# Patient Record
Sex: Male | Born: 1985 | Race: Black or African American | Hispanic: No | Marital: Single | Smoking: Former smoker
Health system: Southern US, Community
[De-identification: ages and names within clinical notes are randomized; demographics above are authoritative.]

## PROBLEM LIST (undated history)

## (undated) DIAGNOSIS — R51 Headache: Secondary | ICD-10-CM

## (undated) DIAGNOSIS — R519 Headache, unspecified: Secondary | ICD-10-CM

## (undated) HISTORY — PX: WISDOM TOOTH EXTRACTION: SHX21

## (undated) HISTORY — DX: Headache, unspecified: R51.9

## (undated) HISTORY — DX: Headache: R51

---
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
## 2007-12-09 ENCOUNTER — Emergency Department (HOSPITAL_COMMUNITY): Admission: EM | Admit: 2007-12-09 | Discharge: 2007-12-09 | Payer: Self-pay | Admitting: Emergency Medicine

## 2008-04-22 ENCOUNTER — Emergency Department (HOSPITAL_COMMUNITY): Admission: EM | Admit: 2008-04-22 | Discharge: 2008-04-22 | Payer: Self-pay | Admitting: Emergency Medicine

## 2008-04-27 ENCOUNTER — Emergency Department (HOSPITAL_COMMUNITY): Admission: EM | Admit: 2008-04-27 | Discharge: 2008-04-27 | Payer: Self-pay | Admitting: Emergency Medicine

## 2008-08-05 ENCOUNTER — Emergency Department (HOSPITAL_COMMUNITY): Admission: EM | Admit: 2008-08-05 | Discharge: 2008-08-05 | Payer: Self-pay | Admitting: Emergency Medicine

## 2008-10-18 ENCOUNTER — Emergency Department (HOSPITAL_COMMUNITY): Admission: EM | Admit: 2008-10-18 | Discharge: 2008-10-19 | Payer: Self-pay | Admitting: Emergency Medicine

## 2009-07-23 ENCOUNTER — Emergency Department (HOSPITAL_COMMUNITY): Admission: EM | Admit: 2009-07-23 | Discharge: 2009-07-23 | Payer: Self-pay | Admitting: Emergency Medicine

## 2009-08-25 ENCOUNTER — Emergency Department (HOSPITAL_COMMUNITY): Admission: EM | Admit: 2009-08-25 | Discharge: 2009-08-25 | Payer: Self-pay | Admitting: Emergency Medicine

## 2009-10-02 ENCOUNTER — Encounter: Admission: RE | Admit: 2009-10-02 | Discharge: 2009-10-02 | Payer: Self-pay | Admitting: Family Medicine

## 2010-02-14 ENCOUNTER — Encounter
Admission: RE | Admit: 2010-02-14 | Discharge: 2010-02-14 | Payer: Self-pay | Source: Home / Self Care | Attending: Family Medicine | Admitting: Family Medicine

## 2010-02-25 ENCOUNTER — Other Ambulatory Visit (HOSPITAL_COMMUNITY): Payer: Self-pay | Admitting: Orthopedic Surgery

## 2010-02-25 DIAGNOSIS — M79673 Pain in unspecified foot: Secondary | ICD-10-CM

## 2010-02-27 ENCOUNTER — Other Ambulatory Visit (HOSPITAL_BASED_OUTPATIENT_CLINIC_OR_DEPARTMENT_OTHER): Payer: Self-pay | Admitting: Orthopedic Surgery

## 2010-02-27 ENCOUNTER — Other Ambulatory Visit (HOSPITAL_COMMUNITY): Payer: Self-pay

## 2010-02-27 DIAGNOSIS — R52 Pain, unspecified: Secondary | ICD-10-CM

## 2010-02-28 ENCOUNTER — Encounter (HOSPITAL_COMMUNITY): Payer: Self-pay

## 2010-02-28 ENCOUNTER — Ambulatory Visit (HOSPITAL_COMMUNITY)
Admission: RE | Admit: 2010-02-28 | Discharge: 2010-02-28 | Disposition: A | Discharge status: Home / Self Care | Payer: BC Managed Care – PPO | Source: Ambulatory Visit | Attending: Orthopedic Surgery | Admitting: Orthopedic Surgery

## 2010-02-28 DIAGNOSIS — M25476 Effusion, unspecified foot: Secondary | ICD-10-CM | POA: Insufficient documentation

## 2010-02-28 DIAGNOSIS — M79609 Pain in unspecified limb: Secondary | ICD-10-CM | POA: Insufficient documentation

## 2010-02-28 DIAGNOSIS — M79673 Pain in unspecified foot: Secondary | ICD-10-CM

## 2010-02-28 DIAGNOSIS — M25473 Effusion, unspecified ankle: Secondary | ICD-10-CM | POA: Insufficient documentation

## 2010-02-28 DIAGNOSIS — R52 Pain, unspecified: Secondary | ICD-10-CM

## 2010-02-28 DIAGNOSIS — R609 Edema, unspecified: Secondary | ICD-10-CM | POA: Insufficient documentation

## 2010-03-27 ENCOUNTER — Other Ambulatory Visit (HOSPITAL_COMMUNITY): Payer: Self-pay | Admitting: Orthopedic Surgery

## 2010-03-27 DIAGNOSIS — M25571 Pain in right ankle and joints of right foot: Secondary | ICD-10-CM

## 2010-04-04 ENCOUNTER — Ambulatory Visit (HOSPITAL_COMMUNITY)
Admission: RE | Admit: 2010-04-04 | Discharge: 2010-04-04 | Disposition: A | Discharge status: Home / Self Care | Payer: BC Managed Care – PPO | Source: Ambulatory Visit | Attending: Orthopedic Surgery | Admitting: Orthopedic Surgery

## 2010-04-04 DIAGNOSIS — M25571 Pain in right ankle and joints of right foot: Secondary | ICD-10-CM

## 2010-04-04 DIAGNOSIS — M25579 Pain in unspecified ankle and joints of unspecified foot: Secondary | ICD-10-CM | POA: Insufficient documentation

## 2010-04-04 DIAGNOSIS — M722 Plantar fascial fibromatosis: Secondary | ICD-10-CM | POA: Insufficient documentation
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
## 2010-08-18 ENCOUNTER — Ambulatory Visit: Payer: BC Managed Care – PPO | Attending: Orthopedic Surgery

## 2010-08-18 DIAGNOSIS — R262 Difficulty in walking, not elsewhere classified: Secondary | ICD-10-CM | POA: Insufficient documentation

## 2010-08-18 DIAGNOSIS — IMO0001 Reserved for inherently not codable concepts without codable children: Secondary | ICD-10-CM | POA: Insufficient documentation

## 2010-08-18 DIAGNOSIS — M2550 Pain in unspecified joint: Secondary | ICD-10-CM | POA: Insufficient documentation

## 2010-08-18 DIAGNOSIS — M2560 Stiffness of unspecified joint, not elsewhere classified: Secondary | ICD-10-CM | POA: Insufficient documentation

## 2010-08-18 DIAGNOSIS — R5381 Other malaise: Secondary | ICD-10-CM | POA: Insufficient documentation

## 2010-08-19 ENCOUNTER — Ambulatory Visit: Payer: BC Managed Care – PPO | Admitting: Physical Therapy

## 2010-08-28 ENCOUNTER — Ambulatory Visit: Payer: BC Managed Care – PPO | Attending: Orthopedic Surgery

## 2010-08-28 DIAGNOSIS — IMO0001 Reserved for inherently not codable concepts without codable children: Secondary | ICD-10-CM | POA: Insufficient documentation

## 2010-08-28 DIAGNOSIS — R262 Difficulty in walking, not elsewhere classified: Secondary | ICD-10-CM | POA: Insufficient documentation

## 2010-08-28 DIAGNOSIS — M2550 Pain in unspecified joint: Secondary | ICD-10-CM | POA: Insufficient documentation

## 2010-08-28 DIAGNOSIS — R5381 Other malaise: Secondary | ICD-10-CM | POA: Insufficient documentation

## 2010-08-28 DIAGNOSIS — M2560 Stiffness of unspecified joint, not elsewhere classified: Secondary | ICD-10-CM | POA: Insufficient documentation

## 2010-09-02 ENCOUNTER — Ambulatory Visit: Payer: BC Managed Care – PPO | Admitting: Physical Therapy

## 2010-09-03 ENCOUNTER — Ambulatory Visit: Payer: BC Managed Care – PPO | Admitting: Physical Therapy

## 2010-09-04 ENCOUNTER — Ambulatory Visit: Payer: BC Managed Care – PPO | Admitting: Physical Therapy

## 2010-09-09 ENCOUNTER — Ambulatory Visit: Payer: BC Managed Care – PPO | Admitting: Physical Therapy

## 2010-09-12 ENCOUNTER — Ambulatory Visit: Payer: BC Managed Care – PPO

## 2010-09-16 ENCOUNTER — Ambulatory Visit: Payer: BC Managed Care – PPO | Admitting: Physical Therapy

## 2010-09-17 ENCOUNTER — Ambulatory Visit: Payer: BC Managed Care – PPO | Admitting: Physical Therapy

## 2010-09-22 ENCOUNTER — Ambulatory Visit: Payer: BC Managed Care – PPO | Admitting: Physical Therapy

## 2010-09-30 ENCOUNTER — Ambulatory Visit: Payer: BC Managed Care – PPO

## 2010-10-06 ENCOUNTER — Encounter: Payer: BC Managed Care – PPO | Admitting: Physical Therapy

## 2010-10-08 ENCOUNTER — Encounter: Payer: BC Managed Care – PPO | Admitting: Physical Therapy

## 2010-10-22 ENCOUNTER — Ambulatory Visit: Payer: BC Managed Care – PPO | Attending: Orthopedic Surgery | Admitting: Physical Therapy

## 2010-10-22 ENCOUNTER — Ambulatory Visit: Payer: BC Managed Care – PPO | Admitting: Physical Therapy

## 2010-10-22 DIAGNOSIS — IMO0001 Reserved for inherently not codable concepts without codable children: Secondary | ICD-10-CM | POA: Insufficient documentation

## 2010-10-22 DIAGNOSIS — M2560 Stiffness of unspecified joint, not elsewhere classified: Secondary | ICD-10-CM | POA: Insufficient documentation

## 2010-10-22 DIAGNOSIS — M2550 Pain in unspecified joint: Secondary | ICD-10-CM | POA: Insufficient documentation

## 2010-10-22 DIAGNOSIS — R5381 Other malaise: Secondary | ICD-10-CM | POA: Insufficient documentation

## 2010-10-22 DIAGNOSIS — R262 Difficulty in walking, not elsewhere classified: Secondary | ICD-10-CM | POA: Insufficient documentation

## 2010-10-30 ENCOUNTER — Encounter: Payer: BC Managed Care – PPO | Admitting: Rehabilitation

## 2010-10-31 ENCOUNTER — Ambulatory Visit: Payer: BC Managed Care – PPO | Attending: Orthopedic Surgery | Admitting: Rehabilitation

## 2010-10-31 DIAGNOSIS — IMO0001 Reserved for inherently not codable concepts without codable children: Secondary | ICD-10-CM | POA: Insufficient documentation

## 2010-10-31 DIAGNOSIS — R5381 Other malaise: Secondary | ICD-10-CM | POA: Insufficient documentation

## 2010-10-31 DIAGNOSIS — R262 Difficulty in walking, not elsewhere classified: Secondary | ICD-10-CM | POA: Insufficient documentation

## 2010-10-31 DIAGNOSIS — M2560 Stiffness of unspecified joint, not elsewhere classified: Secondary | ICD-10-CM | POA: Insufficient documentation

## 2010-10-31 DIAGNOSIS — M2550 Pain in unspecified joint: Secondary | ICD-10-CM | POA: Insufficient documentation

## 2010-11-04 ENCOUNTER — Encounter: Payer: BC Managed Care – PPO | Admitting: Physical Therapy

## 2010-11-06 ENCOUNTER — Ambulatory Visit: Payer: BC Managed Care – PPO

## 2010-11-17 ENCOUNTER — Ambulatory Visit: Payer: BC Managed Care – PPO

## 2010-11-19 ENCOUNTER — Ambulatory Visit: Payer: BC Managed Care – PPO | Admitting: Rehabilitation

## 2010-11-24 ENCOUNTER — Ambulatory Visit: Payer: BC Managed Care – PPO | Admitting: Rehabilitation

## 2010-11-26 ENCOUNTER — Ambulatory Visit: Payer: BC Managed Care – PPO | Admitting: Physical Therapy

## 2010-12-09 ENCOUNTER — Ambulatory Visit: Payer: BC Managed Care – PPO | Attending: Orthopedic Surgery | Admitting: Rehabilitation

## 2010-12-09 DIAGNOSIS — M2560 Stiffness of unspecified joint, not elsewhere classified: Secondary | ICD-10-CM | POA: Insufficient documentation

## 2010-12-09 DIAGNOSIS — IMO0001 Reserved for inherently not codable concepts without codable children: Secondary | ICD-10-CM | POA: Insufficient documentation

## 2010-12-09 DIAGNOSIS — R262 Difficulty in walking, not elsewhere classified: Secondary | ICD-10-CM | POA: Insufficient documentation

## 2010-12-09 DIAGNOSIS — R5381 Other malaise: Secondary | ICD-10-CM | POA: Insufficient documentation

## 2010-12-09 DIAGNOSIS — M2550 Pain in unspecified joint: Secondary | ICD-10-CM | POA: Insufficient documentation

## 2010-12-10 ENCOUNTER — Ambulatory Visit: Payer: BC Managed Care – PPO

## 2010-12-10 ENCOUNTER — Encounter: Payer: BC Managed Care – PPO | Admitting: Rehabilitation

## 2011-06-12 ENCOUNTER — Encounter (HOSPITAL_COMMUNITY): Payer: Self-pay

## 2011-06-12 ENCOUNTER — Emergency Department (HOSPITAL_COMMUNITY)
Admission: EM | Admit: 2011-06-12 | Discharge: 2011-06-12 | Disposition: A | Discharge status: Home / Self Care | Payer: No Typology Code available for payment source | Attending: Emergency Medicine | Admitting: Emergency Medicine

## 2011-06-12 ENCOUNTER — Emergency Department (HOSPITAL_COMMUNITY): Payer: No Typology Code available for payment source

## 2011-06-12 DIAGNOSIS — M25579 Pain in unspecified ankle and joints of unspecified foot: Secondary | ICD-10-CM | POA: Insufficient documentation

## 2011-06-12 DIAGNOSIS — Z79899 Other long term (current) drug therapy: Secondary | ICD-10-CM | POA: Insufficient documentation

## 2011-06-12 DIAGNOSIS — Y92410 Unspecified street and highway as the place of occurrence of the external cause: Secondary | ICD-10-CM | POA: Insufficient documentation

## 2011-06-12 DIAGNOSIS — Y998 Other external cause status: Secondary | ICD-10-CM | POA: Insufficient documentation

## 2011-06-12 DIAGNOSIS — M546 Pain in thoracic spine: Secondary | ICD-10-CM | POA: Insufficient documentation

## 2011-06-12 DIAGNOSIS — M25519 Pain in unspecified shoulder: Secondary | ICD-10-CM | POA: Insufficient documentation

## 2011-06-12 MED ORDER — CYCLOBENZAPRINE HCL 10 MG PO TABS: 10.0000 mg | ORAL_TABLET | Freq: Two times a day (BID) | ORAL | Status: AC | PRN

## 2011-06-12 NOTE — Progress Notes (Signed)
Orthopedic Tech Progress Note Patient Details:  Carl Ward 1985/11/24 [id number]  Other Ortho Devices Type of Ortho Device: ASO Ortho Device Location: right ankle Ortho Device Interventions: Application   Zaviyar Rahal 06/12/2011, 5:10 PM
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
## 2011-06-12 NOTE — ED Notes (Signed)
Patient transported to X-ray 
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
## 2011-06-14 NOTE — ED Provider Notes (Signed)
Medical screening examination/treatment/procedure(s) were performed by non-physician practitioner and as supervising physician I was immediately available for consultation/collaboration.  Flint Melter, MD 06/14/11 1550

## 2013-01-16 IMAGING — CR DG FOOT 2V*R*
2 series · 2 of 2 positions shown · non-contrast
Comparison: None.

CLINICAL DATA: Right heel pain since an injury 1 month ago.

RIGHT FOOT - 2 VIEW

[view not recorded (1 of 2)]
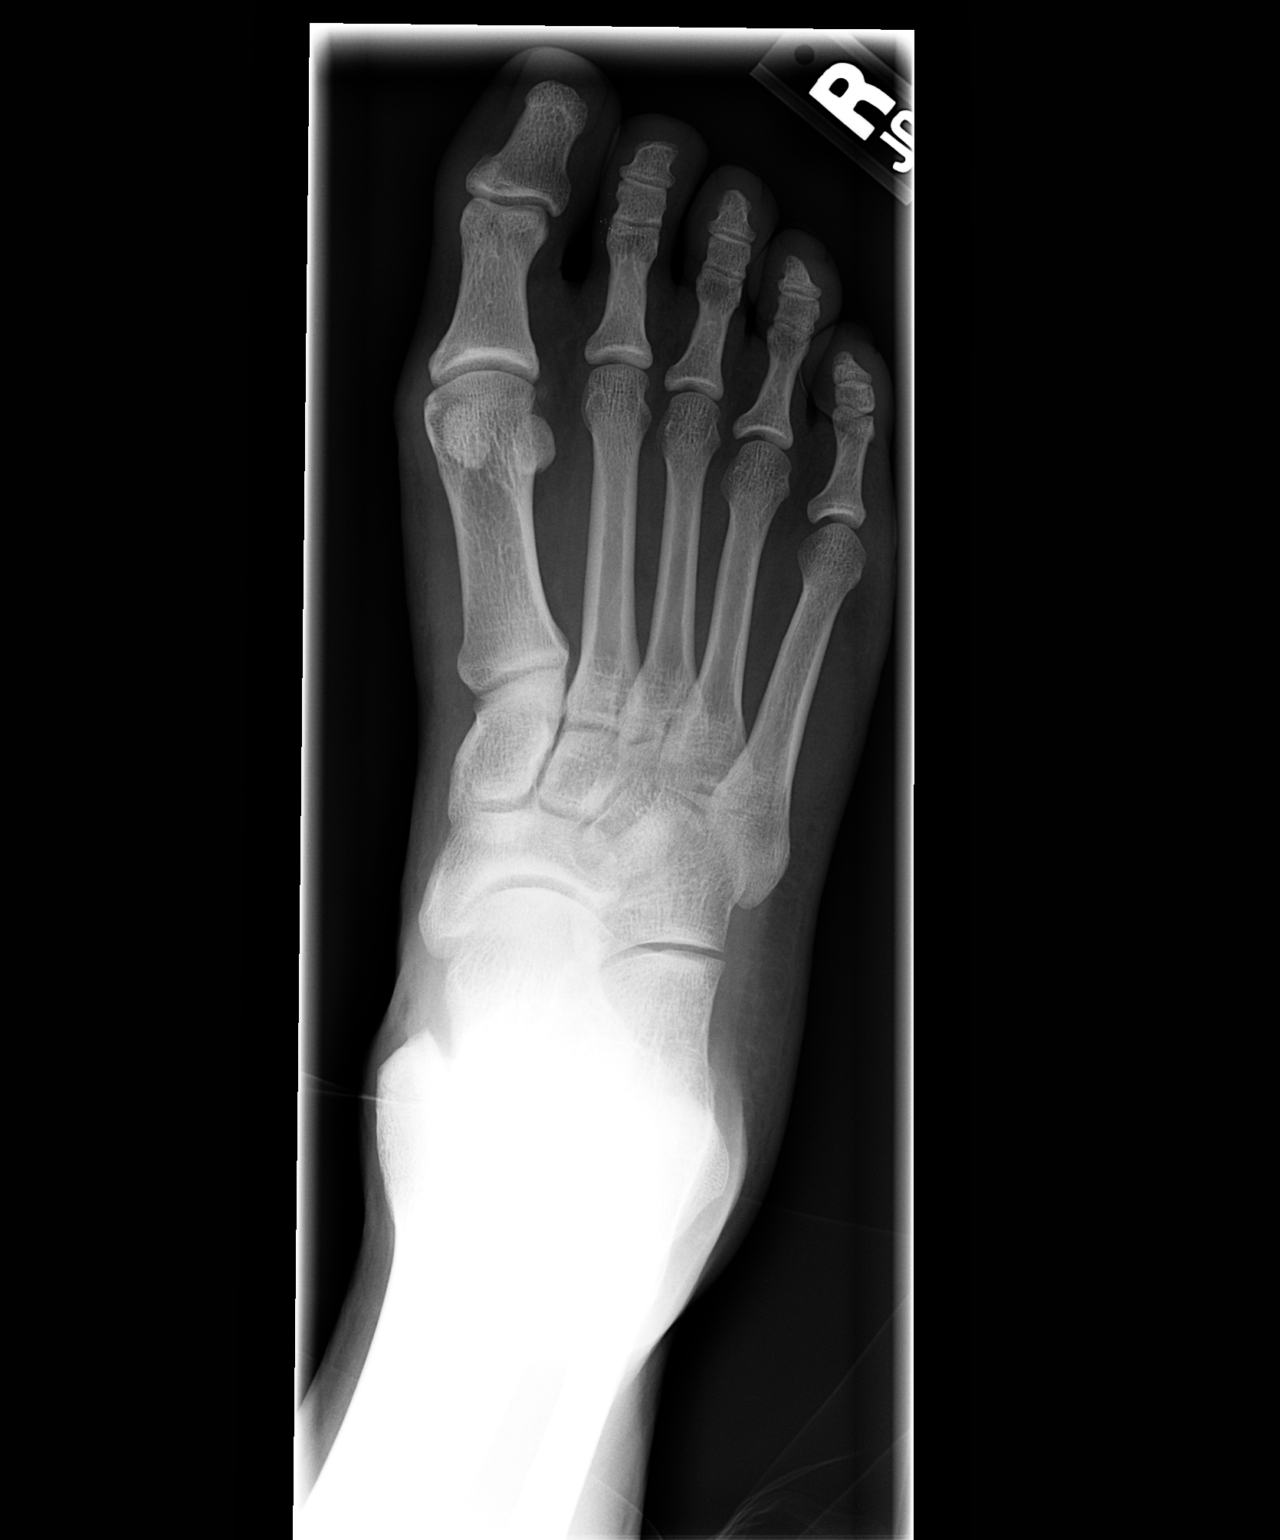

[view not recorded (2 of 2)]
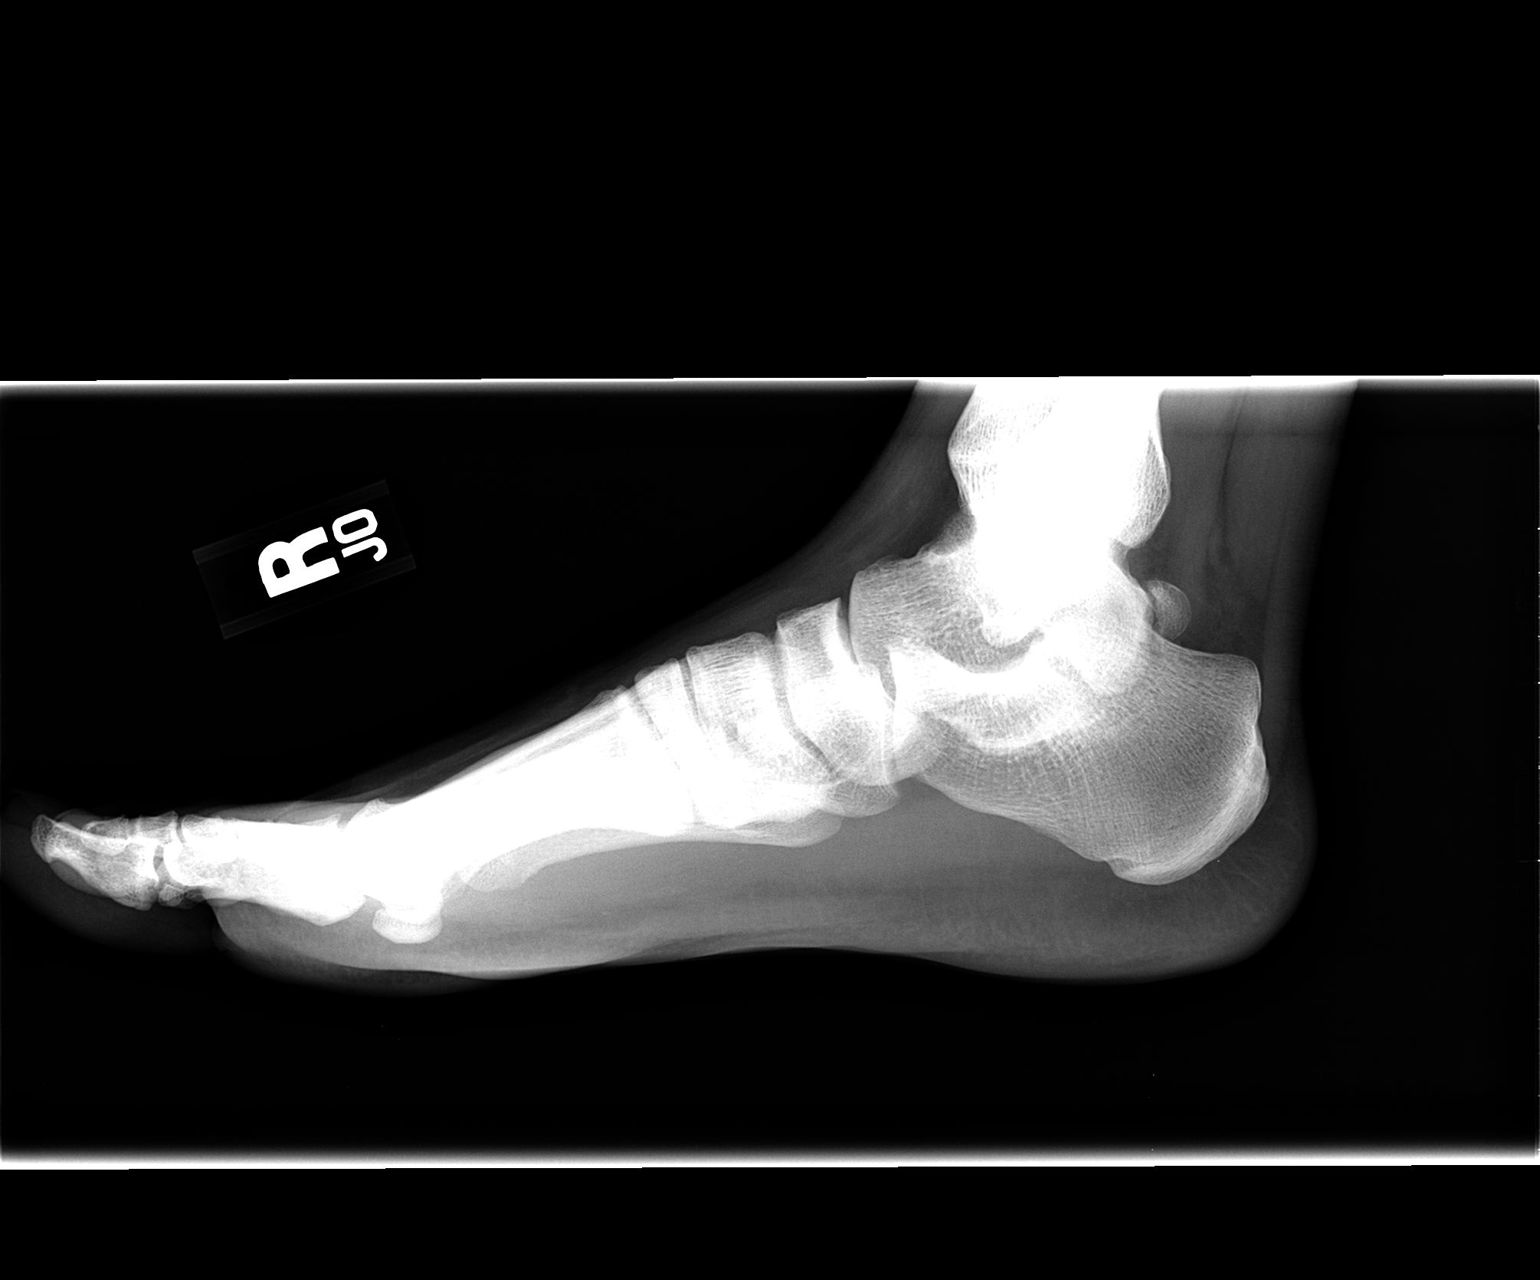

[2 of 2 positions shown; findings below may reference images not displayed]

FINDINGS: The osseous structures of the right foot are normal.
Specifically, the calcaneus is normal.  Os trigonum is noted at the
posterior aspect of the talus, a normal variant.
IMPRESSION: Normal right foot.

## 2014-09-23 ENCOUNTER — Emergency Department (HOSPITAL_COMMUNITY)
Admission: EM | Admit: 2014-09-23 | Discharge: 2014-09-23 | Disposition: A | Discharge status: Home / Self Care | Payer: Self-pay | Attending: Emergency Medicine | Admitting: Emergency Medicine

## 2014-09-23 ENCOUNTER — Emergency Department (HOSPITAL_COMMUNITY): Payer: Self-pay

## 2014-09-23 ENCOUNTER — Encounter (HOSPITAL_COMMUNITY): Payer: Self-pay

## 2014-09-23 DIAGNOSIS — Z880 Allergy status to penicillin: Secondary | ICD-10-CM | POA: Insufficient documentation

## 2014-09-23 DIAGNOSIS — R0600 Dyspnea, unspecified: Secondary | ICD-10-CM | POA: Insufficient documentation

## 2014-09-23 DIAGNOSIS — R51 Headache: Secondary | ICD-10-CM | POA: Insufficient documentation

## 2014-09-23 DIAGNOSIS — R42 Dizziness and giddiness: Secondary | ICD-10-CM | POA: Insufficient documentation

## 2014-09-23 DIAGNOSIS — R0982 Postnasal drip: Secondary | ICD-10-CM | POA: Insufficient documentation

## 2014-09-23 DIAGNOSIS — H578 Other specified disorders of eye and adnexa: Secondary | ICD-10-CM | POA: Insufficient documentation

## 2014-09-23 LAB — I-STAT CHEM 8, ED
BUN: 19 mg/dL (ref 6–20)
CALCIUM ION: 1.06 mmol/L — AB (ref 1.12–1.23)
Chloride: 105 mmol/L (ref 101–111)
Creatinine, Ser: 0.8 mg/dL (ref 0.61–1.24)
GLUCOSE: 102 mg/dL — AB (ref 65–99)
HCT: 49 % (ref 39.0–52.0)
Hemoglobin: 16.7 g/dL (ref 13.0–17.0)
Potassium: 3.7 mmol/L (ref 3.5–5.1)
SODIUM: 139 mmol/L (ref 135–145)
TCO2: 22 mmol/L (ref 0–100)
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
## 2014-09-23 NOTE — ED Notes (Signed)
Pt ambulated w/ a steady gait around nursing station and O2 saturation remained 100% on RA.

## 2014-09-23 NOTE — ED Provider Notes (Signed)
CSN: [id number]     Arrival date & time 09/23/14  0012 History  This chart was scribed for Carl Severin, MD by Doreatha Martin, ED Scribe. This patient was seen in room RESB/RESB and the patient's care was started at 12:22 AM.     Chief Complaint  Patient presents with  . Shortness of Breath   The history is provided by the patient. No language interpreter was used.    HPI Comments: Carl Ward is a 29 y.o. male who presents to the Emergency Department complaining of moderate SOB onset 4 years ago and worsened 30 minutes ago with associated postnasal drip in the mornings, HA, dizziness, a constant "smell of bleach" since cleaning this afternoon. He notes that he stayed in a house with a lot of mold since 2012 and reports that it feels difficult to breath there. Pt states that he moved out of the house today. He notes that he cleaned with household bleach at 1430 and has not been in the environment since. Pt is a non-smoker. He notes some occasional recreational drug use, but none today. He denies cough, wheezing.   History reviewed. No pertinent past medical history. History reviewed. No pertinent past surgical history. History reviewed. No pertinent family history. Social History  Substance Use Topics  . Smoking status: Never Smoker   . Smokeless tobacco: None  . Alcohol Use: Yes    Review of Systems  HENT: Positive for postnasal drip.   Respiratory: Positive for shortness of breath. Negative for cough and wheezing.   Neurological: Positive for dizziness and headaches.  All other systems reviewed and are negative.  Allergies  Penicillins  Home Medications   Prior to Admission medications   Not on File   BP 123/79 mmHg  Pulse 71  Temp(Src) 97.8 F (36.6 C) (Oral)  Resp 20  Ht  (1.727 m)  Wt 160 lb (72.576 kg)  BMI 24.33 kg/m2  SpO2 100% Physical Exam  Constitutional: He is oriented to person, place, and time. He appears well-developed and well-nourished. He appears  distressed (anxious appearing).  HENT:  Head: Normocephalic and atraumatic.  Nose: Nose normal.  Mouth/Throat: Oropharynx is clear and moist.  Eyes: EOM are normal. Pupils are equal, round, and reactive to light.  Conjunctiva erythematous  Neck: Normal range of motion. Neck supple. No JVD present. No tracheal deviation present. No thyromegaly present.  Cardiovascular: Normal rate, regular rhythm, normal heart sounds and intact distal pulses.  Exam reveals no gallop and no friction rub.   No murmur heard. Pulmonary/Chest: Effort normal and breath sounds normal. No stridor. No respiratory distress. He has no wheezes. He has no rales. He exhibits no tenderness.  Abdominal: Soft. Bowel sounds are normal. He exhibits no distension and no mass. There is no tenderness. There is no rebound and no guarding.  Musculoskeletal: Normal range of motion. He exhibits no edema or tenderness.  Lymphadenopathy:    He has no cervical adenopathy.  Neurological: He is alert and oriented to person, place, and time. He displays normal reflexes. He exhibits normal muscle tone. Coordination normal.  Skin: Skin is warm and dry. No rash noted. No erythema. No pallor.  Psychiatric: He has a normal mood and affect. His behavior is normal. Judgment and thought content normal.  Nursing note and vitals reviewed.   ED Course  Procedures (including critical care time) DIAGNOSTIC STUDIES: Oxygen Saturation is 100% on RA, normal by my interpretation.    COORDINATION OF CARE: 12:27 AM Discussed treatment  plan with pt at bedside and pt agreed to plan.   Labs Review Labs Reviewed  I-STAT CHEM 8, ED - Abnormal; Notable for the following:    Glucose, Bld 102 (*)    Calcium, Ion 1.06 (*)    All other components within normal limits    Imaging Review Dg Chest 2 View  09/23/2014   CLINICAL DATA:  Shortness of breath and dyspnea, onset 4 years ago. Patient is been staying in house that has mold and he was cleaning it with  bleach.  EXAM: CHEST  2 VIEW  COMPARISON:  None.  FINDINGS: The heart size and mediastinal contours are within normal limits. Both lungs are clear. The visualized skeletal structures are unremarkable.  IMPRESSION: No active cardiopulmonary disease.   Electronically Signed   By: Burman Nieves M.D.   On: 09/23/2014 01:24   I have personally reviewed and evaluated these images and lab results as part of my medical decision-making.   EKG Interpretation   Date/Time:  Sunday September 23 2014 00:15:22 EDT Ventricular Rate:  67 PR Interval:  166 QRS Duration: 108 QT Interval:  400 QTC Calculation: 422 R Axis:   71 Text Interpretation:  Sinus rhythm No old tracing to compare Confirmed by  Syriana Croslin  MD, Jensyn Shave ([id number]) on 09/23/2014 12:29:03 AM      MDM   Final diagnoses:  Dyspnea    I personally performed the services described in this documentation, which was scribed in my presence. The recorded information has been reviewed and is accurate.  29 yo male with sob for 4 years, acutely worse today after using household bleach.  Pt speaking in full paragraphs, no respiratory distress.  Eyes reddened.  Pt has refused ABG.  Will get cxr, bmp.    Carl Severin, MD 09/23/14 [phone]

## 2014-09-23 NOTE — Discharge Instructions (Signed)
Your workup today has not shown any acute abnormalities.  Your symptoms may be secondary to bleach use earlier today.  They should improve with time.  Avoid smoking any substances for the next week.  This will only irritate your lungs.  Further.  Return to the emergency department for worsening condition or new concerning symptoms.   Shortness of Breath Shortness of breath means you have trouble breathing. It could also mean that you have a medical problem. You should get immediate medical care for shortness of breath. CAUSES   Not enough oxygen in the air such as with high altitudes or a smoke-filled room.  Certain lung diseases, infections, or problems.  Heart disease or conditions, such as angina or heart failure.  Low red blood cells (anemia).  Poor physical fitness, which can cause shortness of breath when you exercise.  Chest or back injuries or stiffness.  Being overweight.  Smoking.  Anxiety, which can make you feel like you are not getting enough air. DIAGNOSIS  Serious medical problems can often be found during your physical exam. Tests may also be done to determine why you are having shortness of breath. Tests may include:  Chest X-rays.  Lung function tests.  Blood tests.  An electrocardiogram (ECG).  An ambulatory electrocardiogram. An ambulatory ECG records your heartbeat patterns over a 24-hour period.  Exercise testing.  A transthoracic echocardiogram (TTE). During echocardiography, sound waves are used to evaluate how blood flows through your heart.  A transesophageal echocardiogram (TEE).  Imaging scans. Your health care provider may not be able to find a cause for your shortness of breath after your exam. In this case, it is important to have a follow-up exam with your health care provider as directed.  TREATMENT  Treatment for shortness of breath depends on the cause of your symptoms and can vary greatly. HOME CARE INSTRUCTIONS   Do not smoke.  Smoking is a common cause of shortness of breath. If you smoke, ask for help to quit.  Avoid being around chemicals or things that may bother your breathing, such as paint fumes and dust.  Rest as needed. Slowly resume your usual activities.  If medicines were prescribed, take them as directed for the full length of time directed. This includes oxygen and any inhaled medicines.  Keep all follow-up appointments as directed by your health care provider. SEEK MEDICAL CARE IF:   Your condition does not improve in the time expected.  You have a hard time doing your normal activities even with rest.  You have any new symptoms. SEEK IMMEDIATE MEDICAL CARE IF:   Your shortness of breath gets worse.  You feel light-headed, faint, or develop a cough not controlled with medicines.  You start coughing up blood.  You have pain with breathing.  You have chest pain or pain in your arms, shoulders, or abdomen.  You have a fever.  You are unable to walk up stairs or exercise the way you normally do. MAKE SURE YOU:  Understand these instructions.  Will watch your condition.  Will get help right away if you are not doing well or get worse. Document Released: 10/07/2000 Document Revised: 01/17/2013 Document Reviewed: 03/30/2011 [facility] Patient Information 2015 [place], [place]. This information is not intended to replace advice given to you by your health care provider. Make sure you discuss any questions you have with your health care provider.

## 2014-12-10 ENCOUNTER — Emergency Department (INDEPENDENT_AMBULATORY_CARE_PROVIDER_SITE_OTHER)
Admission: EM | Admit: 2014-12-10 | Discharge: 2014-12-10 | Disposition: A | Discharge status: Home / Self Care | Payer: Self-pay | Source: Home / Self Care | Attending: Family Medicine | Admitting: Family Medicine

## 2014-12-10 ENCOUNTER — Encounter (HOSPITAL_COMMUNITY): Payer: Self-pay | Admitting: *Deleted

## 2014-12-10 DIAGNOSIS — J302 Other seasonal allergic rhinitis: Secondary | ICD-10-CM

## 2014-12-10 MED ORDER — AZITHROMYCIN 250 MG PO TABS: ORAL_TABLET | ORAL | Status: DC

## 2014-12-10 MED ORDER — PREDNISONE 50 MG PO TABS: ORAL_TABLET | ORAL | Status: DC

## 2014-12-10 MED ORDER — IPRATROPIUM BROMIDE 0.06 % NA SOLN: 2.0000 | Freq: Four times a day (QID) | NASAL | Status: DC

## 2014-12-10 NOTE — ED Provider Notes (Signed)
CSN: [id number]     Arrival date & time 12/10/14  1309 History   First MD Initiated Contact with Patient 12/10/14 1448     Chief Complaint  Patient presents with  . Facial Pain   (Consider location/radiation/quality/duration/timing/severity/associated sxs/prior Treatment) Patient is a 29 y.o. male presenting with URI. The history is provided by the patient.  URI Presenting symptoms: congestion, facial pain and rhinorrhea   Presenting symptoms: no fever   Severity:  Moderate Onset quality:  Gradual Duration:  3 weeks Progression:  Unchanged Chronicity:  New Ineffective treatments:  OTC medications Associated symptoms: sneezing   Risk factors comment:  New apt.   History reviewed. No pertinent past medical history. History reviewed. No pertinent past surgical history. History reviewed. No pertinent family history. Social History  Substance Use Topics  . Smoking status: Never Smoker   . Smokeless tobacco: None  . Alcohol Use: Yes    Review of Systems  Constitutional: Negative.  Negative for fever.  HENT: Positive for congestion, postnasal drip, rhinorrhea and sneezing.   Respiratory: Negative.   Cardiovascular: Negative.   All other systems reviewed and are negative.   Allergies  Penicillins  Home Medications   Prior to Admission medications   Medication Sig Start Date End Date Taking? Authorizing Provider  Chlorphen-Phenyleph-ASA (ALKA-SELTZER PLUS COLD PO) Take by mouth.   Yes Historical Provider, MD  azithromycin (ZITHROMAX Z-PAK) 250 MG tablet Take as directed on pack 12/10/14   Linna HoffJames D Lee Kuang, MD  ipratropium (ATROVENT) 0.06 % nasal spray Place 2 sprays into both nostrils 4 (four) times daily. 12/10/14   Linna HoffJames D Mayer Vondrak, MD  predniSONE (DELTASONE) 50 MG tablet 1 tab daily for 2 days then 1/2 tab daily for 2 days. 12/10/14   Linna HoffJames D Jaynie Hitch, MD   Meds Ordered and Administered this Visit  Medications - No data to display  BP 115/73 mmHg  Pulse 71  Temp(Src) 98.1  F (36.7 C) (Oral)  Resp 16  SpO2 97% No data found.   Physical Exam  Constitutional: He is oriented to person, place, and time. He appears well-developed and well-nourished. No distress.  HENT:  Right Ear: External ear normal.  Left Ear: External ear normal.  Mouth/Throat: Oropharynx is clear and moist.  Eyes: Conjunctivae are normal. Pupils are equal, round, and reactive to light.  Neck: Normal range of motion. Neck supple.  Cardiovascular: Normal rate, regular rhythm, normal heart sounds and intact distal pulses.   Pulmonary/Chest: Effort normal and breath sounds normal.  Lymphadenopathy:    He has no cervical adenopathy.  Neurological: He is alert and oriented to person, place, and time.  Skin: Skin is warm and dry.  Nursing note and vitals reviewed.   ED Course  Procedures (including critical care time)  Labs Review Labs Reviewed - No data to display  Imaging Review No results found.   Visual Acuity Review  Right Eye Distance:   Left Eye Distance:   Bilateral Distance:    Right Eye Near:   Left Eye Near:    Bilateral Near:         MDM   1. Seasonal allergic rhinitis       Linna HoffJames D Deah Ottaway, MD 12/10/14 2052

## 2014-12-10 NOTE — Discharge Instructions (Signed)
Drink plenty of fluids as discussed, use medicine as prescribed, and mucinex or delsym for cough. Return or see your doctor if further problems °

## 2014-12-10 NOTE — ED Notes (Signed)
Pt    Reports     Sinus  Congestion  /  Pressure      With onset  Of  About  3  Weeks        Unreleived  By  otc  meds      Pt  Reports  He  Has been taking   Saline  Spray  As   Well as  Alka seltzer plus   For the  Symptoms

## 2014-12-19 ENCOUNTER — Encounter (HOSPITAL_COMMUNITY): Payer: Self-pay | Admitting: Emergency Medicine

## 2014-12-19 ENCOUNTER — Emergency Department (INDEPENDENT_AMBULATORY_CARE_PROVIDER_SITE_OTHER)
Admission: EM | Admit: 2014-12-19 | Discharge: 2014-12-19 | Disposition: A | Discharge status: Home / Self Care | Payer: Self-pay | Source: Home / Self Care

## 2014-12-19 DIAGNOSIS — J309 Allergic rhinitis, unspecified: Secondary | ICD-10-CM

## 2014-12-19 NOTE — ED Provider Notes (Signed)
CSN: [id number]     Arrival date & time 12/19/14  1311 History   None    Chief Complaint  Patient presents with  . Fatigue  . Headache  . Dizziness   (Consider location/radiation/quality/duration/timing/severity/associated sxs/prior Treatment) HPI 29 year old male previously seen in the urgent care couple weeks ago represents today with chief complaint of dizziness headache and fatigue symptoms now have been going on for approximately 6-8 weeks. Patient states that is related to allergies as he found animal hair, dandruff and a heating duct. His and states that he is having no improvement of of his symptoms at this previous treatment. States that he continues to have sinus congestion. Patient was treated with azithromycin Atrovent and prednisone. History reviewed. No pertinent past medical history. History reviewed. No pertinent past surgical history. History reviewed. No pertinent family history. Social History  Substance Use Topics  . Smoking status: Never Smoker   . Smokeless tobacco: None  . Alcohol Use: Yes    Review of Systems  Allergies  Penicillins  Home Medications   Prior to Admission medications   Medication Sig Start Date End Date Taking? Authorizing Provider  azithromycin (ZITHROMAX Z-PAK) 250 MG tablet Take as directed on pack 12/10/14   Linna HoffJames D Kindl, MD  Chlorphen-Phenyleph-ASA (ALKA-SELTZER PLUS COLD PO) Take by mouth.    Historical Provider, MD  ipratropium (ATROVENT) 0.06 % nasal spray Place 2 sprays into both nostrils 4 (four) times daily. 12/10/14   Linna HoffJames D Kindl, MD  predniSONE (DELTASONE) 50 MG tablet 1 tab daily for 2 days then 1/2 tab daily for 2 days. 12/10/14   Linna HoffJames D Kindl, MD   Meds Ordered and Administered this Visit  Medications - No data to display  BP 105/97 mmHg  Pulse 78  Temp(Src) 98.2 F (36.8 C) (Oral)  Resp 16  SpO2 99% No data found.   Physical Exam NURSES NOTES AND VITAL SIGNS REVIEWED. CONSTITUTIONAL: Well developed, well  nourished, no acute distress HEENT: normocephalic, atraumatic EYES: Conjunctiva normal  PULMONARY:No respiratory distress, normal effort, Lungs: CTAb/l CARDIOVASCULAR: RRR, no murmur  MUSCULOSKELETAL: Normal ROM of all extremities SKIN: warm and dry without rash PSYCHIATRIC: Mood and affect normal  ED Course  Procedures (including critical care time)  Labs Review Labs Reviewed - No data to display  Imaging Review No results found.   Visual Acuity Review  Right Eye Distance:   Left Eye Distance:   Bilateral Distance:    Right Eye Near:   Left Eye Near:    Bilateral Near:         MDM   1. Allergic sinusitis    Unable to provide radiology studies today. Pt would like to return on Friday. At this point in time I have advised patient that chest x-ray not give him the answer that he is looking for but we will be happy to do the study. May also require dilation by an allergy specialist.    Tharon AquasFrank C Patrick, PA 12/19/14 1436

## 2014-12-19 NOTE — Discharge Instructions (Signed)
Allergies An allergy is when your body reacts to a substance in a way that is not normal. An allergic reaction can happen after you:  Eat something.  Breathe in something.  Touch something. WHAT KINDS OF ALLERGIES ARE THERE? You can be allergic to:  Things that are only around during certain seasons, like molds and pollens.  Foods.  Drugs.  Insects.  Animal dander. WHAT ARE SYMPTOMS OF ALLERGIES?  Puffiness (swelling). This may happen on the lips, face, tongue, mouth, or throat.  Sneezing.  Coughing.  Breathing loudly (wheezing).  Stuffy nose.  Tingling in the mouth.  A rash.  Itching.  Itchy, red, puffy areas of skin (hives).  Watery eyes.  Throwing up (vomiting).  Watery poop (diarrhea).  Dizziness.  Feeling faint or fainting.  Trouble breathing or swallowing.  A tight feeling in the chest.  A fast heartbeat. HOW ARE ALLERGIES DIAGNOSED? Allergies can be diagnosed with:  A medical and family history.  Skin tests.  Blood tests.  A food diary. A food diary is a record of all the foods, drinks, and symptoms you have each day.  The results of an elimination diet. This diet involves making sure not to eat certain foods and then seeing what happens when you start eating them again. HOW ARE ALLERGIES TREATED? There is no cure for allergies, but allergic reactions can be treated with medicine. Severe reactions usually need to be treated at a hospital.  HOW CAN REACTIONS BE PREVENTED? The best way to prevent an allergic reaction is to avoid the thing you are allergic to. Allergy shots and medicines can also help prevent reactions in some cases.   This information is not intended to replace advice given to you by your health care provider. Make sure you discuss any questions you have with your health care provider.   Document Released: 05/09/2012 Document Revised: 02/02/2014 Document Reviewed: 10/24/2013 Elsevier Interactive Patient Education 2016  Elsevier Inc.  Allergy Skin Testing WHY AM I HAVING THIS TEST? Allergy skin testing is done to check whether you have an allergy to something. Testing may be done in one of two ways:  Injecting a small amount of the substance you may be allergic to (allergen).  Applying patches to your skin. Your health care provider will determine the results of your test by checking for an allergic reaction on the skin where the allergen was injected or where the patches were applied.  HOW DO I PREPARE FOR THE TEST?  Let your health care provider know about all medicines you are taking, including vitamins, herbs, eye drops, creams, and over-the-counter medicines. Some medicines can affect test results. Your health care provider will let you know when to stop taking those medicines and when you can begin taking them again.  If you are having a patch test:  Do not apply ointments, creams, or lotion to the skin where the patch will be placed. Usually, the patches are placed on your forearm or on your back.  Bring any items that you think you are allergic to, such as cosmetics, soaps, and perfume. WILL I NEED TO DO ANYTHING AT HOME? If you will receive an injection, you will not need to do anything at home. If patches will be applied to your skin, you will need to:  Wear them for 48 hours.  Return to your health care provider's office to have them removed. Do not remove them yourself.  Avoid bathing and activities that cause heavy sweating until after the patches  are removed. WHAT ARE THE REFERENCE RANGES? Reference ranges are considered healthy ranges established after testing a large group of healthy people. Reference ranges may vary among different people, labs, and hospitals.  The reference range for allergy skin testing is a swollen area of skin (wheal) less than 3mm in diameter, with surrounding redness and swelling (flare) less than 10mm in diameter.  WHAT DO THE RESULTS MEAN?  A result within  the reference range means you are probably not allergic to the allergen.  A result in which the wheal is 3mm or more and the flare is 10mm or more means you are likely allergic to the allergen. Your health care provider will consider the results of your test in addition to your symptoms before diagnosing you with an allergy. Talk with your health care provider to discuss your results, treatment options, and if necessary, the need for more tests. Talk with your health care provider if you have any questions about your results.   This information is not intended to replace advice given to you by your health care provider. Make sure you discuss any questions you have with your health care provider.   Document Released: 02/05/2004 Document Revised: 02/02/2014 Document Reviewed: 10/24/2013 Elsevier Interactive Patient Education [employer] Elsevier Inc.

## 2014-12-19 NOTE — ED Notes (Signed)
[facility] a [facility] of dizziness, headache, and fatigue that has been ongoing for around 1 month.

## 2015-04-02 ENCOUNTER — Emergency Department (HOSPITAL_COMMUNITY)
Admission: EM | Admit: 2015-04-02 | Discharge: 2015-04-02 | Disposition: A | Discharge status: Home / Self Care | Payer: Self-pay

## 2015-04-03 ENCOUNTER — Encounter (HOSPITAL_COMMUNITY): Payer: Self-pay | Admitting: *Deleted

## 2015-04-03 ENCOUNTER — Emergency Department (HOSPITAL_COMMUNITY)
Admission: EM | Admit: 2015-04-03 | Discharge: 2015-04-03 | Disposition: A | Discharge status: Home / Self Care | Payer: BLUE CROSS/BLUE SHIELD | Attending: Emergency Medicine | Admitting: Emergency Medicine

## 2015-04-03 DIAGNOSIS — Z880 Allergy status to penicillin: Secondary | ICD-10-CM | POA: Diagnosis not present

## 2015-04-03 DIAGNOSIS — Z79899 Other long term (current) drug therapy: Secondary | ICD-10-CM | POA: Diagnosis not present

## 2015-04-03 DIAGNOSIS — H9203 Otalgia, bilateral: Secondary | ICD-10-CM | POA: Diagnosis present

## 2015-04-03 DIAGNOSIS — R0981 Nasal congestion: Secondary | ICD-10-CM | POA: Diagnosis not present

## 2015-04-03 NOTE — Discharge Instructions (Signed)
May continue using nasal spray if desired to help with nasal congestion Follow-up with ENT-- if you do not hear from anyone by this afternoon, call office to make your own appt. Return to the ED for new or worsening symptoms.

## 2015-04-03 NOTE — ED Notes (Signed)
Pt reports ear pain, nasal congestion, and congestions. Pt states that this has been ongoing for several months. Pt states that he has tried getting in with an ENT but they are several months out. Pt states that she has taken antibiotics and steroids with no relief.

## 2015-04-03 NOTE — ED Provider Notes (Signed)
CSN: [id number]     Arrival date & time 04/03/15  1037 History  By signing my name below, I, Freida Busmaniana Omoyeni, attest that this documentation has been prepared under the direction and in the presence of non-physician practitioner, Sharilyn SitesLisa Wiliam Cauthorn, PA-C. Electronically Signed: Freida Busmaniana Omoyeni, Scribe. 04/03/2015. 11:35 AM.    Chief Complaint  Patient presents with  . Otalgia    The history is provided by the patient. No language interpreter was used.     HPI Comments:  Dirk DressJoshua J Hemric is a 30 y.o. male who presents to the Emergency Department complaining of progressively worsening  bilateral ear pain x ~ 6 months. He reports associated nasal congestion. Pt has been placed on antibiotics twice, steroids and nasal spray by PCP which he has taken without relief. Pt recently was referred to ENT but came here because he didn't want to wait.  Denies fever, chills, headache, dizziness, cough, chest pain, or SOB. No alleviating factors noted.  History reviewed. No pertinent past medical history. History reviewed. No pertinent past surgical history. No family history on file. Social History  Substance Use Topics  . Smoking status: Never Smoker   . Smokeless tobacco: None  . Alcohol Use: Yes    Review of Systems  Constitutional: Negative for fever and chills.  HENT: Positive for congestion and ear pain.   All other systems reviewed and are negative.   Allergies  Penicillins  Home Medications   Prior to Admission medications   Medication Sig Start Date End Date Taking? Authorizing Provider  azithromycin (ZITHROMAX Z-PAK) 250 MG tablet Take as directed on pack 12/10/14   Linna HoffJames D Kindl, MD  Chlorphen-Phenyleph-ASA (ALKA-SELTZER PLUS COLD PO) Take by mouth.    Historical Provider, MD  ipratropium (ATROVENT) 0.06 % nasal spray Place 2 sprays into both nostrils 4 (four) times daily. 12/10/14   Linna HoffJames D Kindl, MD  predniSONE (DELTASONE) 50 MG tablet 1 tab daily for 2 days then 1/2 tab daily for 2 days.  12/10/14   Linna HoffJames D Kindl, MD   BP 108/66 mmHg  Pulse 65  Temp(Src) 97.8 F (36.6 C) (Oral)  Resp 18  Ht 5\' 8"  (1.727 m)  Wt 154 lb (69.854 kg)  BMI 23.42 kg/m2  SpO2 98%   Physical Exam  Constitutional: He is oriented to person, place, and time. He appears well-developed and well-nourished.  HENT:  Head: Normocephalic and atraumatic.  Right Ear: Hearing and ear canal normal.  Left Ear: Hearing and ear canal normal.  Nose: Mucosal edema (mild) present.  Mouth/Throat: Uvula is midline, oropharynx is clear and moist and mucous membranes are normal.  Eyes: Conjunctivae and EOM are normal. Pupils are equal, round, and reactive to light.  Neck: Normal range of motion.  Cardiovascular: Normal rate, regular rhythm and normal heart sounds.   Pulmonary/Chest: Effort normal and breath sounds normal.  Abdominal: Soft. Bowel sounds are normal.  Musculoskeletal: Normal range of motion.  Neurological: He is alert and oriented to person, place, and time.  Skin: Skin is warm and dry.  Psychiatric: He has a normal mood and affect.  Nursing note and vitals reviewed.   ED Course  Procedures   DIAGNOSTIC STUDIES:  Oxygen Saturation is 98% on RA, normal by my interpretation.    COORDINATION OF CARE:  11:33 AM Discussed treatment plan with pt at bedside and pt agreed to plan.   MDM   Final diagnoses:  Otalgia of both ears  Nasal congestion   30 year old male here with bilateral ear  pain and nasal congestion for the past several months. He has tried antibiotics and steroids twice without relief. Patient is afebrile, nontoxic. His exam is benign aside from some mild nasal congestion. Does not have any current exam findings to suggest otitis media, TM perforation, or sinusitis area he is intermittently his nose baring down to make his ears pop.  At this time, do not feel any further intervention warranted ED. I have offered patient alternative nasal spray for his congestion, he declined. I've  given him referrals to ENT if appointment is not scheduled soon.  Discussed plan with patient, he/she acknowledged understanding and agreed with plan of care.  Return precautions given for new or worsening symptoms.  I personally performed the services described in this documentation, which was scribed in my presence. The recorded information has been reviewed and is accurate.  Garlon Hatchet, PA-C 04/03/15 1256  Rolland Porter, MD 04/10/15 2255

## 2015-04-16 ENCOUNTER — Telehealth: Payer: Self-pay | Admitting: *Deleted

## 2015-04-16 ENCOUNTER — Ambulatory Visit: Payer: BLUE CROSS/BLUE SHIELD | Admitting: Neurology

## 2015-04-16 NOTE — Telephone Encounter (Signed)
Patient called morning of the new patient appointment to cancel.

## 2015-04-22 ENCOUNTER — Encounter: Payer: Self-pay | Admitting: Neurology

## 2015-04-22 ENCOUNTER — Ambulatory Visit (INDEPENDENT_AMBULATORY_CARE_PROVIDER_SITE_OTHER): Payer: BLUE CROSS/BLUE SHIELD | Admitting: Neurology

## 2015-04-22 VITALS — BP 106/70 | HR 60 | Ht 68.0 in | Wt 153.0 lb

## 2015-04-22 DIAGNOSIS — R51 Headache: Secondary | ICD-10-CM | POA: Diagnosis not present

## 2015-04-22 DIAGNOSIS — R519 Headache, unspecified: Secondary | ICD-10-CM | POA: Insufficient documentation

## 2015-04-22 MED ORDER — NORTRIPTYLINE HCL 25 MG PO CAPS: 50.0000 mg | ORAL_CAPSULE | Freq: Every day | ORAL | Status: DC

## 2015-04-22 MED ORDER — SUMATRIPTAN SUCCINATE 50 MG PO TABS: 50.0000 mg | ORAL_TABLET | ORAL | Status: DC | PRN

## 2015-04-22 NOTE — Progress Notes (Signed)
PATIENT: Carl Ward DOB: [birth year]  Chief Complaint  Patient presents with  . Migraine    He has some type of headache daily that tends to vary in severity.  Aleve gives him limited relief.  He has recently had a normal ENT evaluation.  He has throbbing pain in his temple area and jaw.  He often has neck/shoulder stiffness and eye pain.  Feels caffeine use makes his symptoms worse.  His frequent headaches started in October 2016.     HISTORICAL  Carl Ward is a 30 years old right-handed male, seen in refer by ENT Dr. Pollyann Kennedy in April 22 2015 for evaluation of frequent headaches, his primary care is Dr. Docia Chuck.  He had occasionally headaches in the past, bilateral retro-orbital area severe pounding headache was associated light noise sensitivity, nauseous, he usually have migraine couple times each year, triggered by pork  In October 2016, he began to have frequent headaches, bilateral ear fullness sensation, mild TMJ joints pain, he associated his worsening headache with cleaning a vent that is full of dust, he also complained of shortness of breath for a while, he was evaluated by ENT Dr. Pollyann Kennedy in March 2017 there was no significant abnormality, for a while he was taking Excedrin Migraine twice daily, which only helped him temporarily.  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   REVIEW OF SYSTEMS: Full 14 system review of systems performed and notable only for Fatigue, eye pain, shortness of breath, ringing ears, allergies, confusion, headaches, numbness, weakness, dizziness, depression anxiety  ALLERGIES: Allergies  Allergen Reactions  . Penicillins Hives    HOME MEDICATIONS: Current Outpatient Prescriptions  Medication Sig  Dispense Refill  . Naproxen Sodium (ALEVE PO) Take by mouth as needed.     No current facility-administered medications for this visit.    PAST MEDICAL HISTORY: Past Medical History  Diagnosis Date  . Headache     PAST SURGICAL HISTORY: Past Surgical History  Procedure Laterality Date  . Wisdom tooth extraction      x 1    FAMILY HISTORY: Family History  Problem Relation Age of Onset  . Gout Paternal Grandfather   . Heart disease Paternal Grandfather   . Diabetes Paternal Grandmother   . Pancreatic cancer Paternal Grandmother   . Hypertension Paternal Grandfather     SOCIAL HISTORY:  Social History   Social History  . Marital Status: Single    Spouse Name: N/A  . Number of Children: 1  . Years of Education: 12   Occupational History  . Moving Truck     Self-Employed   Social History Main Topics  . Smoking status: Former Games developer  . Smokeless tobacco: Not on file     Comment: Quit 03/27/15  . Alcohol Use: No     Comment: Quit 03/27/15  . Drug Use: No  . Sexual Activity: Yes    Birth Control/ Protection: None   Other Topics Concern  . Not on file   Social History Narrative   Lives at home with his great-grandmother.   Right-handed   No caffeine use.     PHYSICAL EXAM   Filed Vitals:   04/22/15 1332  BP: 106/70  Pulse: 60  Height:  (1.727 m)  Weight:  153 lb (69.4 kg)    Not recorded      Body mass index is 23.27 kg/(m^2).  PHYSICAL EXAMNIATION:  Gen: NAD, conversant, well nourised, obese, well groomed                     Cardiovascular: Regular rate rhythm, no peripheral edema, warm, nontender. Eyes: Conjunctivae clear without exudates or hemorrhage Neck: Supple, no carotid bruise. Pulmonary: Clear to auscultation bilaterally  Musculoskeletal: He complains of mild bilateral TMJ joints pain upon deep palpation, left worse in right  NEUROLOGICAL EXAM:  MENTAL STATUS: Speech:    Speech is normal; fluent and spontaneous with normal  comprehension.  Cognition:     Orientation to time, place and person     Normal recent and remote memory     Normal Attention span and concentration     Normal Language, naming, repeating,spontaneous speech     Fund of knowledge   CRANIAL NERVES: CN II: Visual fields are full to confrontation. Fundoscopic exam is normal with sharp discs and no vascular changes. Pupils are round equal and briskly reactive to light. CN III, IV, VI: extraocular movement are normal. No ptosis. CN V: Facial sensation is intact to pinprick in all 3 divisions bilaterally. Corneal responses are intact.  CN VII: Face is symmetric with normal eye closure and smile. CN VIII: Hearing is normal to rubbing fingers CN IX, X: Palate elevates symmetrically. Phonation is normal. CN XI: Head turning and shoulder shrug are intact CN XII: Tongue is midline with normal movements and no atrophy.  MOTOR: There is no pronator drift of out-stretched arms. Muscle bulk and tone are normal. Muscle strength is normal.  REFLEXES: Reflexes are 2+ and symmetric at the biceps, triceps, knees, and ankles. Plantar responses are flexor.  SENSORY: Intact to light touch, pinprick, position sense, and vibration sense are intact in fingers and toes.  COORDINATION: Rapid alternating movements and fine finger movements are intact. There is no dysmetria on finger-to-nose and heel-knee-shin.    GAIT/STANCE: Posture is normal. Gait is steady with normal steps, base, arm swing, and turning. Heel and toe walking are normal. Tandem gait is normal.  Romberg is absent.   DIAGNOSTIC DATA (LABS, IMAGING, TESTING) - I reviewed patient records, labs, notes, testing and imaging myself where available.   ASSESSMENT AND PLAN  Carl DressJoshua J Kanno is a 30 y.o. male   Daily headaches  With migraine features, also mild TMJ joints pain  Laboratory evaluations, TSH  Nortriptyline 25 mg titrating to 50 mg every night for preventive medications  Imitrex  as needed for headaches  We discussed potential imaging study, after discussed with patient, he has nonfocal neurological signs, we decided to hold off MRI of the brain at this point, if he continue have headaches after medication treatment, we will consider MRI of the brain   Carl Ward, M.D. Ph.D.  [facility] [address], [address] [place], [place] [address] Ph: [phone] Fax: [phone]  CC: ENT Dr. Pollyann Kennedyosen, Dr. Docia ChuckKoirala

## 2015-04-23 ENCOUNTER — Telehealth: Payer: Self-pay | Admitting: Neurology

## 2015-04-23 LAB — CBC
Hematocrit: 50.2 % (ref 37.5–51.0)
Hemoglobin: 17.4 g/dL (ref 12.6–17.7)
MCH: 31.4 pg (ref 26.6–33.0)
MCHC: 34.7 g/dL (ref 31.5–35.7)
MCV: 91 fL (ref 79–97)
PLATELETS: 226 10*3/uL (ref 150–379)
RBC: 5.55 x10E6/uL (ref 4.14–5.80)
RDW: 12.7 % (ref 12.3–15.4)
WBC: 4.5 10*3/uL (ref 3.4–10.8)

## 2015-04-23 LAB — COMPREHENSIVE METABOLIC PANEL
A/G RATIO: 1.8 (ref 1.2–2.2)
ALT: 19 IU/L (ref 0–44)
AST: 20 IU/L (ref 0–40)
Albumin: 4.6 g/dL (ref 3.5–5.5)
Alkaline Phosphatase: 88 IU/L (ref 39–117)
BUN / CREAT RATIO: 14 (ref 8–19)
BUN: 10 mg/dL (ref 6–20)
Bilirubin Total: 0.8 mg/dL (ref 0.0–1.2)
CALCIUM: 10.5 mg/dL — AB (ref 8.7–10.2)
CHLORIDE: 99 mmol/L (ref 96–106)
CO2: 21 mmol/L (ref 18–29)
Creatinine, Ser: 0.72 mg/dL — ABNORMAL LOW (ref 0.76–1.27)
GFR calc non Af Amer: 125 mL/min/{1.73_m2} (ref >59)
GFR, EST AFRICAN AMERICAN: 145 mL/min/{1.73_m2} (ref >59)
Globulin, Total: 2.6 g/dL (ref 1.5–4.5)
Glucose: 88 mg/dL (ref 65–99)
Potassium: 4.6 mmol/L (ref 3.5–5.2)
SODIUM: 138 mmol/L (ref 134–144)
TOTAL PROTEIN: 7.2 g/dL (ref 6.0–8.5)

## 2015-04-23 LAB — TSH: TSH: 0.862 u[IU]/mL (ref 0.450–4.500)

## 2015-04-23 NOTE — Telephone Encounter (Signed)
Please call patient, laboratory showed slight elevated calcium 10 point 5, normal being less than 10 point 2, this is of unknown clinical significance, otherwise normal CMP, CBC, TSH

## 2015-04-23 NOTE — Telephone Encounter (Signed)
Spoke to patient - he is aware of results and will keep his pending appts. 

## 2017-01-22 ENCOUNTER — Encounter (HOSPITAL_COMMUNITY): Payer: Self-pay | Admitting: Emergency Medicine

## 2017-01-22 ENCOUNTER — Ambulatory Visit (HOSPITAL_COMMUNITY)
Admission: EM | Admit: 2017-01-22 | Discharge: 2017-01-22 | Disposition: A | Discharge status: Home / Self Care | Payer: BLUE CROSS/BLUE SHIELD | Attending: Internal Medicine | Admitting: Internal Medicine

## 2017-01-22 ENCOUNTER — Other Ambulatory Visit: Payer: Self-pay

## 2017-01-22 ENCOUNTER — Emergency Department (HOSPITAL_COMMUNITY): Payer: Self-pay

## 2017-01-22 ENCOUNTER — Emergency Department (HOSPITAL_COMMUNITY)
Admission: EM | Admit: 2017-01-22 | Discharge: 2017-01-23 | Disposition: A | Discharge status: Home / Self Care | Payer: Self-pay | Attending: Emergency Medicine | Admitting: Emergency Medicine

## 2017-01-22 DIAGNOSIS — R109 Unspecified abdominal pain: Secondary | ICD-10-CM | POA: Insufficient documentation

## 2017-01-22 DIAGNOSIS — Z87891 Personal history of nicotine dependence: Secondary | ICD-10-CM | POA: Insufficient documentation

## 2017-01-22 DIAGNOSIS — N3000 Acute cystitis without hematuria: Secondary | ICD-10-CM | POA: Insufficient documentation

## 2017-01-22 LAB — I-STAT CG4 LACTIC ACID, ED: Lactic Acid, Venous: 3.09 mmol/L (ref 0.5–1.9)

## 2017-01-22 LAB — URINALYSIS, ROUTINE W REFLEX MICROSCOPIC
Bilirubin Urine: NEGATIVE
Glucose, UA: 50 mg/dL — AB
Hgb urine dipstick: NEGATIVE
Ketones, ur: 20 mg/dL — AB
Nitrite: NEGATIVE
PH: 6 (ref 5.0–8.0)
Protein, ur: 30 mg/dL — AB
SPECIFIC GRAVITY, URINE: 1.015 (ref 1.005–1.030)

## 2017-01-22 LAB — POCT URINALYSIS DIP (DEVICE)
Bilirubin Urine: NEGATIVE
GLUCOSE, UA: NEGATIVE mg/dL
Ketones, ur: NEGATIVE mg/dL
NITRITE: NEGATIVE
Protein, ur: NEGATIVE mg/dL
SPECIFIC GRAVITY, URINE: 1.015 (ref 1.005–1.030)
UROBILINOGEN UA: 0.2 mg/dL (ref 0.0–1.0)
pH: 6.5 (ref 5.0–8.0)

## 2017-01-22 MED ORDER — SODIUM CHLORIDE 0.9 % IV BOLUS (SEPSIS)
1000.0000 mL | Freq: Once | INTRAVENOUS | Status: AC
  Administered 2017-01-22: 1000 mL via INTRAVENOUS

## 2017-01-22 MED ORDER — DIPHENHYDRAMINE HCL 50 MG/ML IJ SOLN
50.0000 mg | Freq: Once | INTRAMUSCULAR | Status: AC
  Administered 2017-01-22: 50 mg via INTRAVENOUS
  Filled 2017-01-22: qty 1

## 2017-01-22 MED ORDER — CEPHALEXIN 500 MG PO CAPS: 500.0000 mg | ORAL_CAPSULE | Freq: Two times a day (BID) | ORAL | 0 refills | Status: DC

## 2017-01-22 MED ORDER — MELOXICAM 7.5 MG PO TABS: 7.5000 mg | ORAL_TABLET | Freq: Every day | ORAL | 0 refills | Status: AC

## 2017-01-22 NOTE — ED Notes (Signed)
X-ray at bedside

## 2017-01-22 NOTE — ED Notes (Signed)
Bed: FA21WA11 Expected date:  Expected time:  Means of arrival:  Comments: Allergic reaction

## 2017-01-22 NOTE — ED Provider Notes (Signed)
[facility]-EMERGENCY DEPT Provider Note   CSN: [id number] Arrival date & time: 01/22/17  2125     History   Chief Complaint Chief Complaint  Patient presents with  . Allergic Reaction    HPI Carl DressJoshua J Sherrard is a 31 y.o. male who presents today by EMS for evaluation of AMS, hypotension.  According to EMS patient had significant hypotension in route with blood pressures as low as 60 systolic and inability to palpate radial pulses.  He was nauseous and vomited multiple times.  EMS denies presence of rash or urticaria.  Does report that he was significantly diaphoretic  Initially.  He was given epinephrine IM x2 enroute.  First dose of epi improved his BP and symptoms for about 10 minutes before he worsened again and was given second dose of epi. Patient reports that he was seen at [facility] urgent care earlier today for UTI symptoms, prescribed meloxicam and Keflex.  He is allergic to penicillins.  He reports that he took the first dose around 1, and then the second dose at around 8.  He reports that after the second time taking the medicines his left eye started turning red and he felt like his chest was tight. he reports that he took a nap and woke up feeling worse.  He reports left-sided back pain that was present prior to this reaction.  He denies any drug use.  Denies any new foods or exposures other than the medications.  He reports that for the past week he has been having dysuria, increased frequency, increased urgency.  He reports that he is sexually active with females and does not always use protection.   HPI  Past Medical History:  Diagnosis Date  . Headache     Patient Active Problem List   Diagnosis Date Noted  . Headache 04/22/2015    Past Surgical History:  Procedure Laterality Date  . WISDOM TOOTH EXTRACTION     x 1       Home Medications    Prior to Admission medications   Medication Sig Start Date End Date Taking? Authorizing Provider    cephALEXin (KEFLEX) 500 MG capsule Take 1 capsule (500 mg total) by mouth 2 (two) times daily for 5 days. 01/22/17 01/27/17 Yes Yu, Amy V, PA-C  meloxicam (MOBIC) 7.5 MG tablet Take 1 tablet (7.5 mg total) by mouth daily. 01/22/17  Yes Yu, Amy V, PA-C  Naproxen Sodium (ALEVE PO) Take by mouth as needed.    [provider]  nortriptyline (PAMELOR) 25 MG capsule Take 2 capsules (50 mg total) by mouth at bedtime. Patient not taking: Reported on 01/22/2017 04/22/15   Levert FeinsteinYan, Yijun, MD  SUMAtriptan (IMITREX) 50 MG tablet Take 1 tablet (50 mg total) by mouth every 2 (two) hours as needed for migraine. May repeat in 2 hours if headache persists or recurs. Patient not taking: Reported on 01/22/2017 04/22/15   Levert FeinsteinYan, Yijun, MD    Family History Family History  Problem Relation Age of Onset  . Gout Paternal Grandfather   . Heart disease Paternal Grandfather   . Hypertension Paternal Grandfather   . Diabetes Paternal Grandmother   . Pancreatic cancer Paternal Grandmother     Social History Social History   Tobacco Use  . Smoking status: Former Games developermoker  . Smokeless tobacco: Never Used  . Tobacco comment: Quit 03/27/15  Substance Use Topics  . Alcohol use: Yes    Alcohol/week: 0.0 oz    Comment: occasional   .  Drug use: No     Allergies   Penicillins   Review of Systems Review of Systems  Unable to perform ROS: Acuity of condition     Physical Exam Updated Vital Signs BP 114/66   Pulse 79   Temp 97.9 F (36.6 C) (Oral)   Resp (!) 24   Ht 5\' 8"  (1.727 m)   Wt 86.2 kg (190 lb)   SpO2 98%   BMI 28.89 kg/m   Physical Exam  Constitutional: He is oriented to person, place, and time. He appears well-developed and well-nourished.  Shaking, pale  HENT:  Head: Normocephalic and atraumatic.  Mouth/Throat: Oropharynx is clear and moist.  Neck: Normal range of motion. Neck supple.  Cardiovascular: Normal rate, regular rhythm, normal heart sounds and intact distal pulses.  No  murmur heard. Pulses:      Radial pulses are 1+ on the right side, and 1+ on the left side.  Pulmonary/Chest: Effort normal and breath sounds normal. No stridor. No respiratory distress. He has no decreased breath sounds. He has no wheezes.  Abdominal: Soft. Normal appearance and bowel sounds are normal. There is no tenderness. There is CVA tenderness (Left sided).  Genitourinary: Penis normal. Circumcised. No phimosis or paraphimosis.  Musculoskeletal: He exhibits no edema.  No midline TTP of T/L spine.    Neurological: He is alert and oriented to person, place, and time.  Skin: Skin is warm. No rash noted. He is diaphoretic.  Psychiatric: He has a normal mood and affect. His behavior is normal.  Nursing note and vitals reviewed.    ED Treatments / Results  Labs (all labs ordered are listed, but only abnormal results are displayed) Labs Reviewed  URINALYSIS, ROUTINE W REFLEX MICROSCOPIC - Abnormal; Notable for the following components:      Result Value   Glucose, UA 50 (*)    Ketones, ur 20 (*)    Protein, ur 30 (*)    Leukocytes, UA SMALL (*)    Bacteria, UA RARE (*)    Squamous Epithelial / LPF 0-5 (*)    All other components within normal limits  CBC WITH DIFFERENTIAL/PLATELET - Abnormal; Notable for the following components:   WBC 17.2 (*)    Neutro Abs 14.1 (*)    Monocytes Absolute 1.9 (*)    All other components within normal limits  I-STAT CG4 LACTIC ACID, ED - Abnormal; Notable for the following components:   Lactic Acid, Venous 3.09 (*)    All other components within normal limits  URINE CULTURE  CBC WITH DIFFERENTIAL/PLATELET  CBC WITH DIFFERENTIAL/PLATELET  COMPREHENSIVE METABOLIC PANEL  I-STAT CG4 LACTIC ACID, ED  I-STAT CG4 LACTIC ACID, ED  GC/CHLAMYDIA PROBE AMP ([place]) NOT AT [facility]    EKG  EKG Interpretation None       Radiology Dg Chest Portable 1 View  Result Date: 01/22/2017 CLINICAL DATA:  UTI symptoms, short of breath EXAM: PORTABLE  CHEST 1 VIEW COMPARISON:  09/23/2014 FINDINGS: The heart size and mediastinal contours are within normal limits. Both lungs are clear. The visualized skeletal structures are unremarkable. IMPRESSION: No active disease. Electronically Signed   By: Jasmine PangKim  Fujinaga M.D.   On: 01/22/2017 22:54    Procedures Procedures (including critical care time)  Medications Ordered in ED Medications  diphenhydrAMINE (BENADRYL) injection 50 mg (50 mg Intravenous Given 01/22/17 2310)  sodium chloride 0.9 % bolus 1,000 mL (1,000 mLs Intravenous New Bag/Given 01/22/17 2314)     Initial Impression / Assessment and Plan /  ED Course  I have reviewed the triage vital signs and the nursing notes.  Pertinent labs & imaging results that were available during my care of the patient were reviewed by me and considered in my medical decision making (see chart for details).  Clinical Course as of Jan 23 113  Fri Jan 22, 2017  2213 Patient re-checked, BP maintaining.   [EH]  2251 Patient re-checked, BP maintaining.  Patient noted occasional nausea.    [EH]  2311 Lactic acid mildly elevated.   [EH]  2322 Patient girlfriend arrives, reports that he was playing video games when this happened and that he got up to use the bathroom and had red eyes.  She reports that he was red, sweaty, and passed out on the couch during this episode.  She reports that  "his penis went away."   when asked to clarify she said it was "like a vacuum on the tip" caused it to shrink.  Genital exam performed with RN in room.  Normal appearing male genitalia.  [EH]  Sat Jan 23, 2017  0007 Patient re-evaluated, resting comfortably.    [EH]  0027 Called lab, samples hemolyzed/clotted, will need re-draw.   [EH]  0049 Patient resting comfortably.  No distress.   [EH]    Clinical Course User Index [EH] Cristina Gong, PA-C   Carl Ward presents today for evaluation of syncope, N/V, diaphoresis and being red after starting mobic and keflex  today for a UTI.  He had two doses of keflex today PTA.  He was reportedly hypotensive en route with EMS unable to palpate radial pulses and hypotension with SBP in 60s.  He was given epinephrine IM twice by EMS with improvement.  Dr. Lynelle Doctor evaluated the patient, unsure if infection vs allergic reaction.  Given fluids, benadryl, steroids held.  At shift change care was transferred to [facility] who will follow pending studies, re-evaulate and determine disposition.     Final Clinical Impressions(s) / ED Diagnoses   Final diagnoses:  None    ED Discharge Orders    None       Norman Clay 01/23/17 0114    Linwood Dibbles, MD 01/24/17 [phone]

## 2017-01-22 NOTE — ED Triage Notes (Signed)
Pt seen by urgent care today for UTI symptoms, prescribed meloxicam and an antibiotic. Patient states all he took today was those two medications. Patient noticed after taking the medication his left eye began turning red and his breathing "got funny." patient took a nap and woke up still feeling worse. Patient hypotensive in the ambulance with shortness of breath and back pain. EDP and EDPA at bedside

## 2017-01-22 NOTE — ED Notes (Signed)
Dr Lynelle DoctorKnapp notified of Lactic Acid of 3.09 @ 2311.

## 2017-01-22 NOTE — ED Triage Notes (Signed)
Pt c/o lower back pain since Saturday, pt states "I think I have a kidney problem". Pt states sometimes hes had a burning sensation when he pees.

## 2017-01-22 NOTE — ED Provider Notes (Signed)
[facility]    CSN: [id number] Arrival date & time: 01/22/17  1000     History   Chief Complaint Chief Complaint  Patient presents with  . Flank Pain  . Dysuria    HPI Carl Ward is a 31 y.o. male.   31 year old male comes in for 1 week history of left mid back pain/flank pain.  Patient states prior to back pain starting, he had dysuria, which has now resolved.  He still experiences urgency without frequency or hematuria.  He states he woke up with the back pain, which is dull/aching, and can be sharp at times.  States it is constant, and can worsen with movement.  He denies radiation of pain, numbness, tingling.  Denies injury/trauma.  Denies fever, chills, night sweats.  Denies abdominal pain, nausea, vomiting.  He sexually active with one partner, no condom use.  He has tried ibuprofen 200 mg once without relief, TEN stimulation without relief.      Past Medical History:  Diagnosis Date  . Headache     Patient Active Problem List   Diagnosis Date Noted  . Headache 04/22/2015    Past Surgical History:  Procedure Laterality Date  . WISDOM TOOTH EXTRACTION     x 1       Home Medications    Prior to Admission medications   Medication Sig Start Date End Date Taking? Authorizing Provider  cephALEXin (KEFLEX) 500 MG capsule Take 1 capsule (500 mg total) by mouth 2 (two) times daily for 5 days. 01/22/17 01/27/17  Belinda FisherYu, Letrell Attwood V, PA-C  meloxicam (MOBIC) 7.5 MG tablet Take 1 tablet (7.5 mg total) by mouth daily. 01/22/17   Cathie HoopsYu, Jayel Inks V, PA-C  Naproxen Sodium (ALEVE PO) Take by mouth as needed.    [provider]  nortriptyline (PAMELOR) 25 MG capsule Take 2 capsules (50 mg total) by mouth at bedtime. 04/22/15   Levert FeinsteinYan, Yijun, MD  SUMAtriptan (IMITREX) 50 MG tablet Take 1 tablet (50 mg total) by mouth every 2 (two) hours as needed for migraine. May repeat in 2 hours if headache persists or recurs. 04/22/15   Levert FeinsteinYan, Yijun, MD    Family History Family History    Problem Relation Age of Onset  . Gout Paternal Grandfather   . Heart disease Paternal Grandfather   . Hypertension Paternal Grandfather   . Diabetes Paternal Grandmother   . Pancreatic cancer Paternal Grandmother     Social History Social History   Tobacco Use  . Smoking status: Former Games developermoker  . Tobacco comment: Quit 03/27/15  Substance Use Topics  . Alcohol use: No    Alcohol/week: 0.0 oz    Comment: Quit 03/27/15  . Drug use: No     Allergies   Penicillins   Review of Systems Review of Systems  Reason unable to perform ROS: See HPI as above.     Physical Exam Triage Vital Signs ED Triage Vitals  Enc Vitals Group     BP 01/22/17 1010 134/76     Pulse Rate 01/22/17 1010 69     Resp 01/22/17 1010 16     Temp 01/22/17 1010 98 F (36.7 C)     Temp src --      SpO2 01/22/17 1010 100 %     Weight --      Height --      Head Circumference --      Peak Flow --      Pain Score 01/22/17 1012 9  Pain Loc --      Pain Edu? --      Excl. in GC? --    No data found.  Updated Vital Signs BP 134/76   Pulse 69   Temp 98 F (36.7 C)   Resp 16   SpO2 100%   Physical Exam  Constitutional: He is oriented to person, place, and time. He appears well-developed and well-nourished. No distress.  HENT:  Head: Normocephalic and atraumatic.  Eyes: Conjunctivae are normal. Pupils are equal, round, and reactive to light.  Neck: Normal range of motion. Neck supple.  Cardiovascular: Normal rate, regular rhythm and normal heart sounds. Exam reveals no gallop and no friction rub.  No murmur heard. Pulmonary/Chest: Effort normal and breath sounds normal. He has no wheezes. He has no rales.  Abdominal: Soft. Bowel sounds are normal. He exhibits no distension. There is no tenderness. There is no rebound, no guarding and no CVA tenderness.  Musculoskeletal:  No tenderness on palpation of the spinous processes, bilateral back.  Full range of motion of the back, dull range of motion  exacerbated the back pain which patient points to the left thoracic region. Strength normal and equal bilaterally. Sensation intact and equal bilaterally.   Neurological: He is alert and oriented to person, place, and time.  Skin: Skin is warm and dry.     UC Treatments / Results  Labs (all labs ordered are listed, but only abnormal results are displayed) Labs Reviewed  POCT URINALYSIS DIP (DEVICE) - Abnormal; Notable for the following components:      Result Value   Hgb urine dipstick TRACE (*)    Leukocytes, UA SMALL (*)    All other components within normal limits  URINE CULTURE  URINE CYTOLOGY ANCILLARY ONLY    EKG  EKG Interpretation None       Radiology No results found.  Procedures Procedures (including critical care time)  Medications Ordered in UC Medications - No data to display   Initial Impression / Assessment and Plan / UC Course  I have reviewed the triage vital signs and the nursing notes.  Pertinent labs & imaging results that were available during my care of the patient were reviewed by me and considered in my medical decision making (see chart for details).    Urine with trace blood and small leukocytes.  Discussed awaiting urine culture prior to treatment, discussed possible STDs causing symptoms, patient would like to be treated for UTI prior to urine culture results.  Start Keflex as directed. Urine culture and cytology sent, patient will be contacted with any positive results that require additional treatment. Patient to refrain from sexual activity for the next 7 days.  Discussed with patient symptoms could also be due to muscle strain, start Mobic as directed.  Heat/ice compress as directed.  Return precautions given.  Patient expresses understanding and agrees to plan.   Final Clinical Impressions(s) / UC Diagnoses   Final diagnoses:  Flank pain    ED Discharge Orders        Ordered    cephALEXin (KEFLEX) 500 MG capsule  2 times daily      01/22/17 1106    meloxicam (MOBIC) 7.5 MG tablet  Daily     01/22/17 1106        Belinda FisherYu, Vickie Ponds V, [place] JerseyPA-C 01/22/17 1118

## 2017-01-22 NOTE — Discharge Instructions (Signed)
Your exam was most consistent with muscle strain. Start mobic as directed. Ice/heat compresses as needed. This can take up to 3-4 weeks to completely resolve, but you should be feeling better each week. Follow up here or with PCP if symptoms worsen, changes for reevaluation. If experience numbness/tingling of the inner thighs, loss of bladder or bowel control, go to the emergency department for evaluation.   Your urine had bacteria in it that could point to an urinary tract infection.  As discussed, we will send the urine to culture as well.  You can start Keflex as directed. Keep hydrated, your urine should be clear to pale yellow in color. Testing sent, you will be contacted with any positive results that requires further treatment. Refrain from sexual activity for the next 7 days. Monitor for any worsening of symptoms, fever, abdominal pain, nausea, vomiting, penile lesion, testicular swelling/pain, penile swelling/pain to follow up for reevaluation.

## 2017-01-23 LAB — CBC WITH DIFFERENTIAL/PLATELET
BASOS PCT: 0 %
Basophils Absolute: 0 10*3/uL (ref 0.0–0.1)
Eosinophils Absolute: 0 10*3/uL (ref 0.0–0.7)
Eosinophils Relative: 0 %
HEMATOCRIT: 44.7 % (ref 39.0–52.0)
HEMOGLOBIN: 15.6 g/dL (ref 13.0–17.0)
LYMPHS ABS: 1.1 10*3/uL (ref 0.7–4.0)
LYMPHS PCT: 7 %
MCH: 30.9 pg (ref 26.0–34.0)
MCHC: 34.9 g/dL (ref 30.0–36.0)
MCV: 88.5 fL (ref 78.0–100.0)
MONOS PCT: 11 %
Monocytes Absolute: 1.9 10*3/uL — ABNORMAL HIGH (ref 0.1–1.0)
NEUTROS ABS: 14.1 10*3/uL — AB (ref 1.7–7.7)
NEUTROS PCT: 82 %
Platelets: 214 10*3/uL (ref 150–400)
RBC: 5.05 MIL/uL (ref 4.22–5.81)
RDW: 12.4 % (ref 11.5–15.5)
WBC: 17.2 10*3/uL — ABNORMAL HIGH (ref 4.0–10.5)

## 2017-01-23 LAB — COMPREHENSIVE METABOLIC PANEL
ALBUMIN: 3.7 g/dL (ref 3.5–5.0)
ALK PHOS: 57 U/L (ref 38–126)
ALT: 28 U/L (ref 17–63)
ANION GAP: 5 (ref 5–15)
AST: 25 U/L (ref 15–41)
BILIRUBIN TOTAL: 1.1 mg/dL (ref 0.3–1.2)
BUN: 10 mg/dL (ref 6–20)
CALCIUM: 8.1 mg/dL — AB (ref 8.9–10.3)
CO2: 25 mmol/L (ref 22–32)
Chloride: 108 mmol/L (ref 101–111)
Creatinine, Ser: 1 mg/dL (ref 0.61–1.24)
GFR calc Af Amer: >60 mL/min (ref >60)
GFR calc non Af Amer: >60 mL/min (ref >60)
GLUCOSE: 110 mg/dL — AB (ref 65–99)
Potassium: 3.8 mmol/L (ref 3.5–5.1)
Sodium: 138 mmol/L (ref 135–145)
TOTAL PROTEIN: 6.6 g/dL (ref 6.5–8.1)

## 2017-01-23 LAB — URINE CULTURE: Culture: NO GROWTH

## 2017-01-23 LAB — I-STAT CG4 LACTIC ACID, ED: Lactic Acid, Venous: 1.34 mmol/L (ref 0.5–1.9)

## 2017-01-23 MED ORDER — DOXYCYCLINE HYCLATE 100 MG PO CAPS: 100.0000 mg | ORAL_CAPSULE | Freq: Two times a day (BID) | ORAL | 0 refills | Status: AC

## 2017-01-23 NOTE — ED Provider Notes (Signed)
Allergic rxn? Hypotensive 2 rounds of Epi  Started on Keflex earlier today, had 2 doses, then had rash, diaphoretic, N, V, syncope. No SOB, anaphylaxis.  BP normal here Labs pending  Plan: review labs, re-evaluate the patient Anticipate d/ch home Change abx to Cipro  3:00 - patient is awake and feeling better. Nausea controlled. Attempting PO challenge now. No rash, SOB, lip/tongue swelling. TNTC WBC's on UA (rare bacteria). Should continue abx for infection. Doubt allergic reaction. Lactic acid initially elevated, normal on recheck. Anticipate discharge home with PCP follow up.   Carl Ward, Carl Earnest, PA-C 01/23/17 [id number]    Rolan BuccoBelfi, Melanie, MD 01/23/17 1500

## 2017-01-23 NOTE — Discharge Instructions (Signed)
Stop taking Keflex and start Doxycycline as directed. It is important to follow up on the STD cultures performed at the Urgent Care yesterday. Follow up with your doctor for recheck in 2 days.

## 2017-01-24 LAB — URINE CULTURE: CULTURE: NO GROWTH

## 2017-01-25 LAB — URINE CYTOLOGY ANCILLARY ONLY
Chlamydia: POSITIVE — AB
Neisseria Gonorrhea: NEGATIVE
TRICH (WINDOWPATH): NEGATIVE

## 2017-01-25 LAB — GC/CHLAMYDIA PROBE AMP (~~LOC~~) NOT AT ARMC
CHLAMYDIA, DNA PROBE: POSITIVE — AB
NEISSERIA GONORRHEA: NEGATIVE

## 2017-01-26 ENCOUNTER — Telehealth (HOSPITAL_COMMUNITY): Payer: Self-pay | Admitting: Internal Medicine

## 2017-01-26 MED ORDER — DOXYCYCLINE HYCLATE 100 MG PO CAPS: 100.0000 mg | ORAL_CAPSULE | Freq: Two times a day (BID) | ORAL | 0 refills | Status: AC

## 2017-01-26 NOTE — Telephone Encounter (Signed)
Clinical staff, please let patient and the health department know that test for chlamydia was positive.  Prescription for doxycycline x 7d given at the ED visit 12/29, have sent additional 3d of therapy to the pharmacy of record, [facility] [address] [facility] [address].  Please refrain from sexual intercourse for 7 days to give the medicine time to work.  Sexual partners need to be notified and tested/treated.  Condoms may reduce risk of reinfection.  Recheck or followup with PCP for further evaluation if symptoms are not improving.    Urine cultures in the urgent care and the ED are negative for UTI.  LM

## 2017-01-27 ENCOUNTER — Other Ambulatory Visit: Payer: Self-pay

## 2017-01-27 ENCOUNTER — Emergency Department (HOSPITAL_COMMUNITY): Payer: Self-pay

## 2017-01-27 ENCOUNTER — Encounter (HOSPITAL_COMMUNITY): Payer: Self-pay | Admitting: Emergency Medicine

## 2017-01-27 ENCOUNTER — Emergency Department (HOSPITAL_COMMUNITY)
Admission: EM | Admit: 2017-01-27 | Discharge: 2017-01-27 | Disposition: A | Discharge status: Home / Self Care | Payer: Self-pay | Attending: Emergency Medicine | Admitting: Emergency Medicine

## 2017-01-27 DIAGNOSIS — Z87891 Personal history of nicotine dependence: Secondary | ICD-10-CM | POA: Insufficient documentation

## 2017-01-27 DIAGNOSIS — Z79899 Other long term (current) drug therapy: Secondary | ICD-10-CM | POA: Insufficient documentation

## 2017-01-27 DIAGNOSIS — A749 Chlamydial infection, unspecified: Secondary | ICD-10-CM | POA: Insufficient documentation

## 2017-01-27 DIAGNOSIS — R109 Unspecified abdominal pain: Secondary | ICD-10-CM | POA: Insufficient documentation

## 2017-01-27 LAB — I-STAT CHEM 8, ED
BUN: 10 mg/dL (ref 6–20)
CHLORIDE: 104 mmol/L (ref 101–111)
Calcium, Ion: 1.21 mmol/L (ref 1.15–1.40)
Creatinine, Ser: 0.7 mg/dL (ref 0.61–1.24)
Glucose, Bld: 99 mg/dL (ref 65–99)
HEMATOCRIT: 50 % (ref 39.0–52.0)
HEMOGLOBIN: 17 g/dL (ref 13.0–17.0)
POTASSIUM: 3.6 mmol/L (ref 3.5–5.1)
SODIUM: 141 mmol/L (ref 135–145)
TCO2: 23 mmol/L (ref 22–32)

## 2017-01-27 LAB — CBC WITH DIFFERENTIAL/PLATELET
BASOS ABS: 0 10*3/uL (ref 0.0–0.1)
BASOS PCT: 0 %
EOS PCT: 2 %
Eosinophils Absolute: 0.1 10*3/uL (ref 0.0–0.7)
HCT: 47.2 % (ref 39.0–52.0)
Hemoglobin: 16.7 g/dL (ref 13.0–17.0)
LYMPHS PCT: 40 %
Lymphs Abs: 2.4 10*3/uL (ref 0.7–4.0)
MCH: 30.8 pg (ref 26.0–34.0)
MCHC: 35.4 g/dL (ref 30.0–36.0)
MCV: 86.9 fL (ref 78.0–100.0)
Monocytes Absolute: 0.5 10*3/uL (ref 0.1–1.0)
Monocytes Relative: 8 %
Neutro Abs: 2.9 10*3/uL (ref 1.7–7.7)
Neutrophils Relative %: 50 %
PLATELETS: 224 10*3/uL (ref 150–400)
RBC: 5.43 MIL/uL (ref 4.22–5.81)
RDW: 12 % (ref 11.5–15.5)
WBC: 5.9 10*3/uL (ref 4.0–10.5)

## 2017-01-27 LAB — URINALYSIS, ROUTINE W REFLEX MICROSCOPIC
Bilirubin Urine: NEGATIVE
Glucose, UA: NEGATIVE mg/dL
KETONES UR: 5 mg/dL — AB
NITRITE: NEGATIVE
PROTEIN: NEGATIVE mg/dL
Specific Gravity, Urine: 1.02 (ref 1.005–1.030)
pH: 5 (ref 5.0–8.0)

## 2017-01-27 MED ORDER — DOXYCYCLINE HYCLATE 100 MG PO CAPS: 100.0000 mg | ORAL_CAPSULE | Freq: Two times a day (BID) | ORAL | 0 refills | Status: DC

## 2017-01-27 MED ORDER — METHOCARBAMOL 500 MG PO TABS: 500.0000 mg | ORAL_TABLET | Freq: Three times a day (TID) | ORAL | 0 refills | Status: AC | PRN

## 2017-01-27 MED ORDER — IOPAMIDOL (ISOVUE-300) INJECTION 61%
INTRAVENOUS | Status: AC
  Administered 2017-01-27: 100 mL
  Filled 2017-01-27: qty 100

## 2017-01-27 NOTE — ED Provider Notes (Signed)
[facility]-EMERGENCY DEPT Provider Note   CSN: [id number] Arrival date & time: 01/27/17  4098     History   Chief Complaint Chief Complaint  Patient presents with  . Flank Pain    HPI Carl Ward is a 32 y.o. male.  HPI Patient presents with left flank pain.  Has had for the last few days but worse today.  Got seen at urgent care and diagnosed with UTI.  Started on Keflex.  Had anaphylactic reaction to that and was seen in the ER again.  Has had white cells without really bacteria and has negative cultures x2.  However patient did have positive chlamydia which patient had not been informed of yet.  States his urine was a little bubbly and had some dysuria.  Now the pain in his left flank got more severe today.  Thinks he could have a kidney infection.  No fevers or chills.  Has been on 4 days of doxycycline. Past Medical History:  Diagnosis Date  . Headache     Patient Active Problem List   Diagnosis Date Noted  . Headache 04/22/2015    Past Surgical History:  Procedure Laterality Date  . WISDOM TOOTH EXTRACTION     x 1       Home Medications    Prior to Admission medications   Medication Sig Start Date End Date Taking? Authorizing Provider  Ascorbic Acid (VITAMIN C) POWD Take 1 application by mouth daily. MIX 1 TABLESPOON WITH WATER OR JUICE   Yes [provider]  doxycycline (VIBRAMYCIN) 100 MG capsule Take 1 capsule (100 mg total) by mouth 2 (two) times daily for 7 days. 01/23/17 01/30/17 Yes Upstill, Melvenia Beam, PA-C  meloxicam (MOBIC) 7.5 MG tablet Take 1 tablet (7.5 mg total) by mouth daily. 01/22/17  Yes Yu, Amy V, PA-C  OIL OF OREGANO PO Take 5 drops by mouth daily.   Yes [provider]  OLIVE LEAF PO Take 5 mLs by mouth daily.   Yes [provider]  doxycycline (VIBRAMYCIN) 100 MG capsule Take 1 capsule (100 mg total) by mouth 2 (two) times daily for 3 days. Patient not taking: Reported on 01/27/2017 01/31/17 02/03/17   Isa Rankin, MD  doxycycline (VIBRAMYCIN) 100 MG capsule Take 1 capsule (100 mg total) by mouth 2 (two) times daily. 01/27/17   Benjiman Core, MD  methocarbamol (ROBAXIN) 500 MG tablet Take 1 tablet (500 mg total) by mouth every 8 (eight) hours as needed for muscle spasms. 01/27/17   Benjiman Core, MD  nortriptyline (PAMELOR) 25 MG capsule Take 2 capsules (50 mg total) by mouth at bedtime. Patient not taking: Reported on 01/22/2017 04/22/15   Levert Feinstein, MD  SUMAtriptan (IMITREX) 50 MG tablet Take 1 tablet (50 mg total) by mouth every 2 (two) hours as needed for migraine. May repeat in 2 hours if headache persists or recurs. Patient not taking: Reported on 01/27/2017 04/22/15   Levert Feinstein, MD    Family History Family History  Problem Relation Age of Onset  . Gout Paternal Grandfather   . Heart disease Paternal Grandfather   . Hypertension Paternal Grandfather   . Diabetes Paternal Grandmother   . Pancreatic cancer Paternal Grandmother     Social History Social History   Tobacco Use  . Smoking status: Former Games developer  . Smokeless tobacco: Never Used  . Tobacco comment: Quit 03/27/15  Substance Use Topics  . Alcohol use: Yes    Alcohol/week: 0.0 oz  Comment: occasional   . Drug use: No     Allergies   Keflex [cephalexin] and Penicillins   Review of Systems Review of Systems  Constitutional: Positive for appetite change. Negative for fever.  HENT: Negative for congestion.   Respiratory: Negative for shortness of breath.   Cardiovascular: Positive for chest pain.  Gastrointestinal: Negative for abdominal pain.  Genitourinary: Positive for dysuria and flank pain. Negative for discharge.  Musculoskeletal: Negative for back pain and myalgias.  Skin: Negative for rash.  Neurological: Negative for syncope and weakness.  Hematological: Negative for adenopathy.  Psychiatric/Behavioral: Negative for agitation and confusion.     Physical Exam Updated Vital Signs BP  121/84 (BP Location: Left Arm)   Pulse 66   Temp 97.9 F (36.6 C) (Oral)   Resp 20   SpO2 100%   Physical Exam  Constitutional: He appears well-developed.  HENT:  Head: Atraumatic.  Eyes: EOM are normal.  Neck: Neck supple.  Cardiovascular: Normal rate.  Pulmonary/Chest: Effort normal. He has no wheezes. He has no rales.  Abdominal: There is no tenderness.  Genitourinary:  Genitourinary Comments: CVA tenderness on left side.  Neurological: He is alert.  Skin: Skin is warm. Capillary refill takes less than 2 seconds.     ED Treatments / Results  Labs (all labs ordered are listed, but only abnormal results are displayed) Labs Reviewed  URINALYSIS, ROUTINE W REFLEX MICROSCOPIC - Abnormal; Notable for the following components:      Result Value   Hgb urine dipstick SMALL (*)    Ketones, ur 5 (*)    Leukocytes, UA TRACE (*)    Bacteria, UA RARE (*)    Squamous Epithelial / LPF 0-5 (*)    All other components within normal limits  CBC WITH DIFFERENTIAL/PLATELET  HIV ANTIBODY (ROUTINE TESTING)  I-STAT CHEM 8, ED    EKG  EKG Interpretation None       Radiology Ct Abdomen Pelvis W Contrast  Result Date: 01/27/2017 CLINICAL DATA:  Left flank pain.  Recent urinary tract infection. EXAM: CT ABDOMEN AND PELVIS WITH CONTRAST TECHNIQUE: Multidetector CT imaging of the abdomen and pelvis was performed using the standard protocol following bolus administration of intravenous contrast. CONTRAST:  100mL ISOVUE-300 IOPAMIDOL (ISOVUE-300) INJECTION 61% COMPARISON:  None. FINDINGS: Lower chest: Normal. Hepatobiliary: Small focal area of fatty infiltration in the left lobe of the liver adjacent to the falciform ligament. Otherwise normal. Biliary tree is normal. Pancreas: Unremarkable. No pancreatic ductal dilatation or surrounding inflammatory changes. Spleen: Normal in size without focal abnormality. Adrenals/Urinary Tract: Adrenal glands are unremarkable. Kidneys are normal, without  renal calculi, focal lesion, or hydronephrosis. Bladder is unremarkable. Stomach/Bowel: Stomach is within normal limits. Appendix appears normal. No evidence of bowel wall thickening, distention, or inflammatory changes. Vascular/Lymphatic: No significant vascular findings are present. No enlarged abdominal or pelvic lymph nodes. Reproductive: Prostate is unremarkable. Other: No abdominal wall hernia or abnormality. No abdominopelvic ascites. Musculoskeletal: No acute or significant osseous findings. IMPRESSION: Essentially normal exam. No acute abnormalities. Specifically, no renal or ureteral or bladder calculi. No evidence of pyelonephritis. Electronically Signed   By: Francene BoyersJames  Maxwell M.D.   On: 01/27/2017 12:43    Procedures Procedures (including critical care time)  Medications Ordered in ED Medications  iopamidol (ISOVUE-300) 61 % injection (100 mLs  Contrast Given 01/27/17 1220)     Initial Impression / Assessment and Plan / ED Course  I have reviewed the triage vital signs and the nursing notes.  Pertinent labs &  imaging results that were available during my care of the patient were reviewed by me and considered in my medical decision making (see chart for details).     Patient with left flank pain.  Has had dysuria.  Urine culture is negative x2 but did come back positive for chlamydia.  CT scan done due to continued pain and was negative.  Will discharge home with muscle relaxer.  Labs reassuring.  Discharge home.  Final Clinical Impressions(s) / ED Diagnoses   Final diagnoses:  Left flank pain  Chlamydia    ED Discharge Orders        Ordered    doxycycline (VIBRAMYCIN) 100 MG capsule  2 times daily     01/27/17 1426    methocarbamol (ROBAXIN) 500 MG tablet  Every 8 hours PRN     01/27/17 1426       Benjiman Core, MD 01/27/17 1504

## 2017-01-27 NOTE — ED Notes (Signed)
ED Provider at bedside. 

## 2017-01-27 NOTE — ED Triage Notes (Signed)
Per pt, states he thinks he has pyelonephritis-states he was seen last week for a UTI-states left flank pain-was given antibiotics but says he had a reaction to them-no N/V-states frequent urination and states his urine has lots of "bubbles" in it which is protein

## 2017-01-27 NOTE — Discharge Instructions (Signed)
Take the next 3-day prescription of the doxycycline.  Follow-up with the health department.

## 2017-01-28 ENCOUNTER — Telehealth (HOSPITAL_COMMUNITY): Payer: Self-pay | Admitting: *Deleted

## 2017-01-28 LAB — HIV ANTIBODY (ROUTINE TESTING W REFLEX): HIV SCREEN 4TH GENERATION: NONREACTIVE

## 2017-02-01 ENCOUNTER — Encounter (HOSPITAL_COMMUNITY): Payer: Self-pay | Admitting: Emergency Medicine

## 2017-02-01 DIAGNOSIS — M2550 Pain in unspecified joint: Secondary | ICD-10-CM | POA: Insufficient documentation

## 2017-02-01 DIAGNOSIS — R1031 Right lower quadrant pain: Secondary | ICD-10-CM | POA: Insufficient documentation

## 2017-02-01 DIAGNOSIS — J00 Acute nasopharyngitis [common cold]: Secondary | ICD-10-CM | POA: Insufficient documentation

## 2017-02-01 DIAGNOSIS — Z87891 Personal history of nicotine dependence: Secondary | ICD-10-CM | POA: Insufficient documentation

## 2017-02-01 NOTE — ED Triage Notes (Addendum)
Patient c/o nasal congestion x3 hours. States "I can't breathe out of both of my nostrils and it makes me panic." Denies cough. C/o left sided flank pain x2 weeks. Seen for same on 1/2. Reports taking doxycycline as prescribed.

## 2017-02-02 ENCOUNTER — Emergency Department (HOSPITAL_COMMUNITY)
Admission: EM | Admit: 2017-02-02 | Discharge: 2017-02-02 | Disposition: A | Discharge status: Home / Self Care | Payer: Self-pay | Attending: Emergency Medicine | Admitting: Emergency Medicine

## 2017-02-02 DIAGNOSIS — M2550 Pain in unspecified joint: Secondary | ICD-10-CM

## 2017-02-02 DIAGNOSIS — J00 Acute nasopharyngitis [common cold]: Secondary | ICD-10-CM

## 2017-02-02 LAB — COMPREHENSIVE METABOLIC PANEL
ALBUMIN: 4.2 g/dL (ref 3.5–5.0)
ALT: 27 U/L (ref 17–63)
AST: 21 U/L (ref 15–41)
Alkaline Phosphatase: 70 U/L (ref 38–126)
Anion gap: 7 (ref 5–15)
BILIRUBIN TOTAL: 1.1 mg/dL (ref 0.3–1.2)
BUN: 9 mg/dL (ref 6–20)
CO2: 25 mmol/L (ref 22–32)
Calcium: 9 mg/dL (ref 8.9–10.3)
Chloride: 105 mmol/L (ref 101–111)
Creatinine, Ser: 0.86 mg/dL (ref 0.61–1.24)
Glucose, Bld: 93 mg/dL (ref 65–99)
POTASSIUM: 3.5 mmol/L (ref 3.5–5.1)
Sodium: 137 mmol/L (ref 135–145)
TOTAL PROTEIN: 7.7 g/dL (ref 6.5–8.1)

## 2017-02-02 LAB — SEDIMENTATION RATE: Sed Rate: 10 mm/hr (ref 0–16)

## 2017-02-02 LAB — CBC WITH DIFFERENTIAL/PLATELET
BASOS ABS: 0 10*3/uL (ref 0.0–0.1)
Basophils Relative: 0 %
Eosinophils Absolute: 0.1 10*3/uL (ref 0.0–0.7)
Eosinophils Relative: 2 %
HEMATOCRIT: 45 % (ref 39.0–52.0)
Hemoglobin: 15.8 g/dL (ref 13.0–17.0)
LYMPHS PCT: 39 %
Lymphs Abs: 2.6 10*3/uL (ref 0.7–4.0)
MCH: 31.2 pg (ref 26.0–34.0)
MCHC: 35.1 g/dL (ref 30.0–36.0)
MCV: 88.8 fL (ref 78.0–100.0)
Monocytes Absolute: 0.7 10*3/uL (ref 0.1–1.0)
Monocytes Relative: 10 %
NEUTROS ABS: 3.3 10*3/uL (ref 1.7–7.7)
NEUTROS PCT: 49 %
Platelets: 260 10*3/uL (ref 150–400)
RBC: 5.07 MIL/uL (ref 4.22–5.81)
RDW: 12.3 % (ref 11.5–15.5)
WBC: 6.7 10*3/uL (ref 4.0–10.5)

## 2017-02-02 LAB — URINALYSIS, ROUTINE W REFLEX MICROSCOPIC
BILIRUBIN URINE: NEGATIVE
Glucose, UA: NEGATIVE mg/dL
Hgb urine dipstick: NEGATIVE
Ketones, ur: NEGATIVE mg/dL
Leukocytes, UA: NEGATIVE
NITRITE: NEGATIVE
PH: 5 (ref 5.0–8.0)
Protein, ur: NEGATIVE mg/dL
Specific Gravity, Urine: 1.021 (ref 1.005–1.030)

## 2017-02-02 MED ORDER — ONDANSETRON 4 MG PO TBDP
4.0000 mg | ORAL_TABLET | Freq: Once | ORAL | Status: AC
  Administered 2017-02-02: 4 mg via ORAL
  Filled 2017-02-02: qty 1

## 2017-02-02 MED ORDER — AZITHROMYCIN 250 MG PO TABS
1000.0000 mg | ORAL_TABLET | Freq: Once | ORAL | Status: AC
Start: 2017-02-02 — End: 2017-02-02
  Administered 2017-02-02: 1000 mg via ORAL
  Filled 2017-02-02: qty 4

## 2017-02-02 MED ORDER — NAPROXEN 500 MG PO TABS: 500.0000 mg | ORAL_TABLET | Freq: Two times a day (BID) | ORAL | 0 refills | Status: AC

## 2017-02-02 MED ORDER — NAPROXEN 500 MG PO TABS
500.0000 mg | ORAL_TABLET | Freq: Once | ORAL | Status: AC
  Administered 2017-02-02: 500 mg via ORAL
  Filled 2017-02-02: qty 1

## 2017-02-02 NOTE — ED Provider Notes (Signed)
[facility]-EMERGENCY DEPT Provider Note   CSN: [id number] Arrival date & time: 02/01/17  2225     History   Chief Complaint Chief Complaint  Patient presents with  . Nasal Congestion  . Flank Pain    HPI Carl Ward is a 32 y.o. male.  HPI 32 year old comes in with chief complaint of nasal congestion, flank pain, joint ache. Patient reports that he has had flank pain for at least the last 10 days.  The pain is quite persistent, and is located mostly in the left side.  Patient denies any burning with urination, blood in the urine, penile discharge.  Patient denies any fevers or chills.   Per chart review patient was diagnosed with STDs in December.  Patient was prescribed doxycycline for his chlamydia infection, however he reports that he started having upset stomach and stopped taking his medicine.  Review of system is positive for multiple joint pain, also going on for the past few days. Pt has pain over the left elbow, right shoulder, hips. Pt has no pleuritic component to the chest pain or cough.  Past Medical History:  Diagnosis Date  . Headache     Patient Active Problem List   Diagnosis Date Noted  . Headache 04/22/2015    Past Surgical History:  Procedure Laterality Date  . WISDOM TOOTH EXTRACTION     x 1      Home Medications    Prior to Admission medications   Medication Sig Start Date End Date Taking? Authorizing Provider  Ascorbic Acid (VITAMIN C) POWD Take 1 application by mouth daily. MIX 1 TABLESPOON WITH WATER OR JUICE   Yes [provider]  doxycycline (VIBRAMYCIN) 100 MG capsule Take 1 capsule (100 mg total) by mouth 2 (two) times daily for 3 days. 01/31/17 02/03/17 Yes Isa Rankin, MD  meloxicam (MOBIC) 7.5 MG tablet Take 1 tablet (7.5 mg total) by mouth daily. 01/22/17  Yes Yu, Amy V, PA-C  methocarbamol (ROBAXIN) 500 MG tablet Take 1 tablet (500 mg total) by mouth every 8 (eight) hours as needed for muscle  spasms. 01/27/17  Yes Benjiman Core, MD  OIL OF OREGANO PO Take 5 drops by mouth daily.   Yes [provider]  OLIVE LEAF PO Take 5 mLs by mouth daily.   Yes [provider]  doxycycline (VIBRAMYCIN) 100 MG capsule Take 1 capsule (100 mg total) by mouth 2 (two) times daily. Patient not taking: Reported on 02/02/2017 01/27/17   Benjiman Core, MD  nortriptyline (PAMELOR) 25 MG capsule Take 2 capsules (50 mg total) by mouth at bedtime. Patient not taking: Reported on 01/22/2017 04/22/15   Levert Feinstein, MD  SUMAtriptan (IMITREX) 50 MG tablet Take 1 tablet (50 mg total) by mouth every 2 (two) hours as needed for migraine. May repeat in 2 hours if headache persists or recurs. Patient not taking: Reported on 01/27/2017 04/22/15   Levert Feinstein, MD    Family History Family History  Problem Relation Age of Onset  . Gout Paternal Grandfather   . Heart disease Paternal Grandfather   . Hypertension Paternal Grandfather   . Diabetes Paternal Grandmother   . Pancreatic cancer Paternal Grandmother     Social History Social History   Tobacco Use  . Smoking status: Former Games developer  . Smokeless tobacco: Never Used  . Tobacco comment: Quit 03/27/15  Substance Use Topics  . Alcohol use: Yes    Alcohol/week: 0.0 oz    Comment: occasional   .  Drug use: No     Allergies   Keflex [cephalexin] and Penicillins   Review of Systems Review of Systems  Constitutional: Positive for fatigue. Negative for chills, diaphoresis and fever.  Cardiovascular: Negative for chest pain.  Gastrointestinal: Negative for nausea and vomiting.  Genitourinary: Positive for flank pain. Negative for dysuria and hematuria.  Musculoskeletal: Positive for arthralgias.  Skin: Negative for rash.  Allergic/Immunologic: Negative for immunocompromised state.  Hematological: Does not bruise/bleed easily.  All other systems reviewed and are negative.    Physical Exam Updated Vital Signs BP 130/87 (BP Location:  Right Arm)   Pulse 90   Temp 98 F (36.7 C) (Oral)   Resp 18   SpO2 100%   Physical Exam   ED Treatments / Results  Labs (all labs ordered are listed, but only abnormal results are displayed) Labs Reviewed  URINE CULTURE  COMPREHENSIVE METABOLIC PANEL  CBC WITH DIFFERENTIAL/PLATELET  URINALYSIS, ROUTINE W REFLEX MICROSCOPIC  RPR  SEDIMENTATION RATE  GC/CHLAM[facility]DIA PROBE AMP [facility]    EKG  EKG Interpretation None       Radiology No results found.  Procedures Procedures (including critical care time)  Medications Ordered in ED Medications  azithromycin (ZITHROMAX) tablet 1,000 mg (1,000 mg Oral Given 02/02/17 0941)  naproxen (NAPROSYN) tablet 500 mg (500 mg Oral Given 02/02/17 0941)  ondansetron (ZOFRAN-ODT) disintegrating tablet 4 mg (4 mg Oral Given 02/02/17 0941)     Initial Impression / Assessment and Plan / ED Course  I have reviewed the triage vital signs and the nursing notes.  Pertinent labs & imaging results that were available during my care of the patient were reviewed by me and considered in my medical decision making (see chart for details).    Patient comes in with chief complaint of nasal congestion, flank pain.  Patient also reports that he is having polyarthralgias for the past several days.  It appears that patient was recently diagnosed with STD and UTI.  Patient did not finish his dose of Keflex or doxycycline.  Patient currently does not have any UTI-like symptoms, penile discharge.  Patient was tested for HIV and who was negative.  Patient also had chest x-ray and a CT scan of the abdomen within the last 2 weeks which were negative.  On my exam patient has no focal tenderness, no splenomegaly.  We will add RPR to his workup, and test urine again for any infection.  Patient denies any family history of lupus, rheumatoid arthritis, sarcoidosis, polymyalgia rheumatica etc.  Patient has PCP.  We have advised him to follow-up with  PCP if the symptoms do not improve.  If the symptoms improve, they likely are due to viral syndrome.  We willFinal Clinical Impressions(s) / ED Diagnoses   Final diagnoses:  None    ED Discharge Orders    None       Derwood KaplanNanavati, Anuoluwapo Mefferd, MD 02/02/17 1008

## 2017-02-02 NOTE — Discharge Instructions (Signed)
°  All the results in the ER are normal, labs and imaging. We are not sure what is causing your symptoms.  No joint ache could be because of viral infection, or it could be a systemic condition that will need diagnosing by your primary care doctor.  The workup in the ER is not complete, and is limited to screening for life threatening and emergent conditions only, so please see a primary care doctor for further evaluation.

## 2017-02-03 LAB — URINE CULTURE: Culture: NO GROWTH

## 2017-02-03 LAB — GC/CHLAMYDIA PROBE AMP (~~LOC~~) NOT AT ARMC
Chlamydia: NEGATIVE
Neisseria Gonorrhea: NEGATIVE

## 2017-02-03 LAB — RPR: RPR: NONREACTIVE

## 2017-09-24 ENCOUNTER — Emergency Department (HOSPITAL_COMMUNITY)
Admission: EM | Admit: 2017-09-24 | Discharge: 2017-09-25 | Disposition: A | Discharge status: Home / Self Care | Payer: Self-pay | Attending: Emergency Medicine | Admitting: Emergency Medicine

## 2017-09-24 ENCOUNTER — Encounter (HOSPITAL_COMMUNITY): Payer: Self-pay | Admitting: Emergency Medicine

## 2017-09-24 DIAGNOSIS — R5381 Other malaise: Secondary | ICD-10-CM | POA: Insufficient documentation

## 2017-09-24 DIAGNOSIS — R0981 Nasal congestion: Secondary | ICD-10-CM | POA: Insufficient documentation

## 2017-09-24 DIAGNOSIS — Z5321 Procedure and treatment not carried out due to patient leaving prior to being seen by health care provider: Secondary | ICD-10-CM | POA: Insufficient documentation

## 2017-09-24 DIAGNOSIS — M6281 Muscle weakness (generalized): Secondary | ICD-10-CM | POA: Insufficient documentation

## 2017-09-24 DIAGNOSIS — F419 Anxiety disorder, unspecified: Secondary | ICD-10-CM | POA: Insufficient documentation

## 2017-09-24 LAB — COMPREHENSIVE METABOLIC PANEL
ALK PHOS: 81 U/L (ref 38–126)
ALT: 16 U/L (ref 0–44)
AST: 21 U/L (ref 15–41)
Albumin: 4.4 g/dL (ref 3.5–5.0)
Anion gap: 16 — ABNORMAL HIGH (ref 5–15)
BILIRUBIN TOTAL: 1.4 mg/dL — AB (ref 0.3–1.2)
BUN: 12 mg/dL (ref 6–20)
CALCIUM: 9.6 mg/dL (ref 8.9–10.3)
CO2: 21 mmol/L — ABNORMAL LOW (ref 22–32)
CREATININE: 0.91 mg/dL (ref 0.61–1.24)
Chloride: 100 mmol/L (ref 98–111)
GFR calc Af Amer: >60 mL/min (ref >60)
Glucose, Bld: 102 mg/dL — ABNORMAL HIGH (ref 70–99)
Potassium: 3.2 mmol/L — ABNORMAL LOW (ref 3.5–5.1)
Sodium: 137 mmol/L (ref 135–145)
TOTAL PROTEIN: 8.6 g/dL — AB (ref 6.5–8.1)

## 2017-09-24 LAB — CBC
HEMATOCRIT: 50.8 % (ref 39.0–52.0)
Hemoglobin: 16.2 g/dL (ref 13.0–17.0)
MCH: 28.5 pg (ref 26.0–34.0)
MCHC: 31.9 g/dL (ref 30.0–36.0)
MCV: 89.4 fL (ref 78.0–100.0)
Platelets: 264 10*3/uL (ref 150–400)
RBC: 5.68 MIL/uL (ref 4.22–5.81)
RDW: 13.2 % (ref 11.5–15.5)
WBC: 6.6 10*3/uL (ref 4.0–10.5)

## 2017-09-24 NOTE — ED Triage Notes (Signed)
Pt presents to ED for assessment of jitteriness, stuffy nose, general malaise and anxiety since he took four shots on an empty stomach and now feels weak and dehydrated.

## 2017-09-25 NOTE — ED Notes (Signed)
Attempted to call patient for room. No answer x1

## 2017-09-25 NOTE — ED Notes (Signed)
Called Patient to be roomed x3 and had no answer. 

## 2017-12-27 ENCOUNTER — Encounter (HOSPITAL_COMMUNITY): Payer: Self-pay

## 2017-12-27 ENCOUNTER — Other Ambulatory Visit: Payer: Self-pay

## 2017-12-27 ENCOUNTER — Emergency Department (HOSPITAL_COMMUNITY)
Admission: EM | Admit: 2017-12-27 | Discharge: 2017-12-27 | Disposition: A | Discharge status: Home / Self Care | Payer: Self-pay | Attending: Emergency Medicine | Admitting: Emergency Medicine

## 2017-12-27 DIAGNOSIS — Z87891 Personal history of nicotine dependence: Secondary | ICD-10-CM | POA: Insufficient documentation

## 2017-12-27 DIAGNOSIS — R0981 Nasal congestion: Secondary | ICD-10-CM | POA: Insufficient documentation

## 2017-12-27 DIAGNOSIS — Z79899 Other long term (current) drug therapy: Secondary | ICD-10-CM | POA: Insufficient documentation

## 2017-12-27 DIAGNOSIS — R0602 Shortness of breath: Secondary | ICD-10-CM | POA: Insufficient documentation

## 2017-12-27 DIAGNOSIS — Z72820 Sleep deprivation: Secondary | ICD-10-CM | POA: Insufficient documentation

## 2017-12-27 DIAGNOSIS — R42 Dizziness and giddiness: Secondary | ICD-10-CM | POA: Insufficient documentation

## 2017-12-27 LAB — ETHANOL: Alcohol, Ethyl (B): <10 mg/dL (ref <10)

## 2017-12-27 LAB — RAPID URINE DRUG SCREEN, HOSP PERFORMED
AMPHETAMINES: NOT DETECTED
BARBITURATES: NOT DETECTED
BENZODIAZEPINES: NOT DETECTED
Cocaine: NOT DETECTED
Opiates: NOT DETECTED
Tetrahydrocannabinol: POSITIVE — AB

## 2017-12-27 LAB — CBC
HEMATOCRIT: 50.1 % (ref 39.0–52.0)
Hemoglobin: 16.2 g/dL (ref 13.0–17.0)
MCH: 28.8 pg (ref 26.0–34.0)
MCHC: 32.3 g/dL (ref 30.0–36.0)
MCV: 89 fL (ref 80.0–100.0)
NRBC: 0 % (ref 0.0–0.2)
Platelets: 236 10*3/uL (ref 150–400)
RBC: 5.63 MIL/uL (ref 4.22–5.81)
RDW: 12.9 % (ref 11.5–15.5)
WBC: 7 10*3/uL (ref 4.0–10.5)

## 2017-12-27 LAB — BASIC METABOLIC PANEL
Anion gap: 9 (ref 5–15)
BUN: 5 mg/dL — AB (ref 6–20)
CHLORIDE: 100 mmol/L (ref 98–111)
CO2: 26 mmol/L (ref 22–32)
Calcium: 9 mg/dL (ref 8.9–10.3)
Creatinine, Ser: 0.88 mg/dL (ref 0.61–1.24)
GFR calc Af Amer: >60 mL/min (ref >60)
GLUCOSE: 88 mg/dL (ref 70–99)
POTASSIUM: 3.6 mmol/L (ref 3.5–5.1)
Sodium: 135 mmol/L (ref 135–145)

## 2017-12-27 LAB — SALICYLATE LEVEL: Salicylate Lvl: <7 mg/dL (ref 2.8–30.0)

## 2017-12-27 LAB — ACETAMINOPHEN LEVEL: Acetaminophen (Tylenol), Serum: <10 ug/mL — ABNORMAL LOW (ref 10–30)

## 2017-12-27 MED ORDER — PROMETHAZINE-DM 6.25-15 MG/5ML PO SYRP: 5.0000 mL | ORAL_SOLUTION | Freq: Four times a day (QID) | ORAL | 0 refills | Status: AC | PRN

## 2017-12-27 MED ORDER — SODIUM CHLORIDE 0.9 % IV BOLUS
1000.0000 mL | Freq: Once | INTRAVENOUS | Status: AC
  Administered 2017-12-27: 1000 mL via INTRAVENOUS

## 2017-12-27 NOTE — ED Notes (Signed)
Pt aware of need for urine sample.  

## 2017-12-27 NOTE — ED Provider Notes (Signed)
[facility] EMERGENCY DEPARTMENT Provider Note   CSN: [id number] Arrival date & time: 12/27/17  1516     History   Chief Complaint Chief Complaint  Patient presents with  . Shortness of Breath    HPI Carl Ward is a 32 y.o. male with a h/o of headaches who presents to the emergency department with a chief complaint of nasal congestion.  The patient states " I came to the emergency department because about an hour ago I stopped being able to breathe out of both sides of my nose."  He also states that he is feeling dizzy and lightheaded.  He states "can't you just let me rest and here for 30 minutes."  Continues to fall asleep multiple times in the middle of our interview.  Denies headache, otalgia, shortness of breath, chest pain, cough, or URI symptoms.  No treatment prior to arrival.  States he drove himself here and has to drive himself home after he leaves. He denies any alcohol, IV, or drug use.  States his last drink of alcohol was over a week ago.  He states that he was until 5 AM gambling and then slept for 2 hours.  When asked if he could provide a urine sample, the patient states "I do not think I can.  I feel dehydrated."  Per chart review, he was seen by Dr. Tacey Heap 2 years ago with neurology for frequent headaches.   The history is provided by the patient. No language interpreter was used.    Past Medical History:  Diagnosis Date  . Headache     Patient Active Problem List   Diagnosis Date Noted  . Headache 04/22/2015    Past Surgical History:  Procedure Laterality Date  . WISDOM TOOTH EXTRACTION     x 1        Home Medications    Prior to Admission medications   Medication Sig Start Date End Date Taking? Authorizing Provider  Ascorbic Acid (VITAMIN C) POWD Take 1 application by mouth daily. MIX 1 TABLESPOON WITH WATER OR JUICE    [provider]  meloxicam (MOBIC) 7.5 MG tablet Take 1 tablet (7.5 mg total) by mouth daily.  01/22/17   Cathie Hoops, Amy V, PA-C  methocarbamol (ROBAXIN) 500 MG tablet Take 1 tablet (500 mg total) by mouth every 8 (eight) hours as needed for muscle spasms. 01/27/17   Benjiman Core, MD  naproxen (NAPROSYN) 500 MG tablet Take 1 tablet (500 mg total) by mouth 2 (two) times daily with a meal. 02/02/17   Nanavati, Ankit, MD  OIL OF OREGANO PO Take 5 drops by mouth daily.    [provider]  OLIVE LEAF PO Take 5 mLs by mouth daily.    [provider]  promethazine-dextromethorphan (PROMETHAZINE-DM) 6.25-15 MG/5ML syrup Take 5 mLs by mouth 4 (four) times daily as needed for cough. 12/27/17   , Coral Else, PA-C    Family History Family History  Problem Relation Age of Onset  . Gout Paternal Grandfather   . Heart disease Paternal Grandfather   . Hypertension Paternal Grandfather   . Diabetes Paternal Grandmother   . Pancreatic cancer Paternal Grandmother     Social History Social History   Tobacco Use  . Smoking status: Former Games developer  . Smokeless tobacco: Never Used  . Tobacco comment: Quit 03/27/15  Substance Use Topics  . Alcohol use: Yes    Alcohol/week: 0.0 standard drinks    Comment: occasional   . Drug  use: No     Allergies   Keflex [cephalexin] and Penicillins   Review of Systems Review of Systems  Constitutional: Negative for appetite change, chills and fever.  HENT: Positive for congestion.   Respiratory: Negative for shortness of breath.   Cardiovascular: Negative for chest pain.  Gastrointestinal: Negative for abdominal pain, diarrhea, nausea and vomiting.  Genitourinary: Negative for dysuria.  Musculoskeletal: Negative for back pain.  Skin: Negative for rash.  Allergic/Immunologic: Negative for immunocompromised state.  Neurological: Negative for headaches.  Psychiatric/Behavioral: Negative for confusion.       Drowsy   Physical Exam Updated Vital Signs BP 112/65   Pulse 61   Temp 98.3 F (36.8 C) (Oral)   Resp 18   Ht 5\' 8"  (1.727 m)    Wt 68 kg   SpO2 99%   BMI 22.81 kg/m   Physical Exam  Constitutional: He appears well-developed.  Non-toxic appearance. He does not appear ill.  HENT:  Head: Normocephalic.  Right Ear: External ear normal. No mastoid tenderness. Tympanic membrane is erythematous.  Left Ear: External ear normal. No mastoid tenderness. Tympanic membrane is erythematous.  Nose: Mucosal edema present. Right sinus exhibits no maxillary sinus tenderness and no frontal sinus tenderness. Left sinus exhibits no maxillary sinus tenderness and no frontal sinus tenderness.  Mouth/Throat: Uvula is midline, oropharynx is clear and moist and mucous membranes are normal. No tonsillar exudate.  Eyes: Pupils are equal, round, and reactive to light. EOM are normal. Right conjunctiva is injected. Left conjunctiva is injected.  Neck: Neck supple.  No discharge bilaterally.  Cardiovascular: Normal rate, regular rhythm, normal heart sounds and intact distal pulses. Exam reveals no gallop and no friction rub.  No murmur heard. Pulmonary/Chest: Effort normal. No stridor. No respiratory distress. He has no wheezes. He has no rales. He exhibits no tenderness.  Abdominal: Soft. Bowel sounds are normal. He exhibits no distension and no mass. There is no tenderness. There is no rebound and no guarding. No hernia.  Neurological: He is alert.  Somnolent, but arousable to voice.  Finger-nose is intact bilaterally.  No pronator drift or Romberg.  No slurred speech.  Alert and oriented x4. No ataxia.  Skin: Skin is warm and dry.  Psychiatric: His behavior is normal.  Nursing note and vitals reviewed.  ED Treatments / Results  Labs (all labs ordered are listed, but only abnormal results are displayed) Labs Reviewed  BASIC METABOLIC PANEL - Abnormal; Notable for the following components:      Result Value   BUN 5 (*)    All other components within normal limits  ACETAMINOPHEN LEVEL - Abnormal; Notable for the following components:    Acetaminophen (Tylenol), Serum <10 (*)    All other components within normal limits  ETHANOL  SALICYLATE LEVEL  CBC  RAPID URINE DRUG SCREEN, HOSP PERFORMED    EKG EKG Interpretation  Date/Time:  Monday December 27 2017 15:23:35 EST Ventricular Rate:  80 PR Interval:    QRS Duration: 102 QT Interval:  365 QTC Calculation: 421 R Axis:   40 Text Interpretation:  Sinus rhythm ST elev, probable normal early repol pattern No significant change since last tracing Confirmed by Gwyneth SproutPlunkett, Whitney ([id number]) on 12/27/2017 4:15:58 PM   Radiology No results found.  Procedures Procedures (including critical care time)  Medications Ordered in ED Medications  sodium chloride 0.9 % bolus 1,000 mL (0 mLs Intravenous Stopped 12/27/17 1758)     Initial Impression / Assessment and Plan / ED Course  I have reviewed the triage vital signs and the nursing notes.  Pertinent labs & imaging results that were available during my care of the patient were reviewed by me and considered in my medical decision making (see chart for details).     32 year old male with a history of headaches who presents to the emergency department with a chief complaint of nasal congestion that he states began 1 hour prior to arrival.  He is drowsy, but easily arousable to voice.  He is alert and oriented x4.  No ataxia.  He states he just wants to nap for like 30 minutes because he was out gambling until 5 AM.  On exam, bilateral eyes are injected.  He also has some mild erythema to the bilateral TMs and nasal congestion.  Suspect URI.  With regard to drowsiness, concern for sleep deprivation versus active intoxication.  Symptoms are not consistent with any specific toxidrome.   Will give a liter of IV fluids and reassess.  Abs are unremarkable.  UDS is pending.  Following fluid administration, the patient reports that he has a feeling much better and he is ready for discharge.  He is ambulatory and is not ataxic.  GCS of 15  prior to discharge.  Appears much improved from previous evaluation. Will discharge with symptomatic treatment for nasal congestion.  He is in no acute distress and safe for discharge home with outpatient follow-up.  Final Clinical Impressions(s) / ED Diagnoses   Final diagnoses:  Nasal congestion  Sleep deprivation    ED Discharge Orders         Ordered    promethazine-dextromethorphan (PROMETHAZINE-DM) 6.25-15 MG/5ML syrup  4 times daily PRN     12/27/17 1759           ,  A, PA-C 12/27/17 Vicki Mallet, MD 12/28/17 2112

## 2017-12-27 NOTE — Discharge Instructions (Addendum)
Thank you for allowing me to care for you today in the Emergency Department.   Avoid driving when you are very sleep deprived as this can put you at risk for being involved in an accident.  Take a 5 mL's of Promethazine DM every 6 hours as needed for nasal congestion.  You can also try sinus rinses to help you with congestion.  Follow-up with your primary care provider if your symptoms do not improve with this regimen in the next 5 to 7 days.  Return to the emergency department if you develop this pain, difficulty breathing, respiratory distress, confusion, persistent vomiting, high fever of greater than 100.5 that does not improve with Tylenol or ibuprofen, or other new, concerning symptoms.

## 2017-12-27 NOTE — ED Triage Notes (Signed)
Pt endorses shortness of breath and lightheadedness x1 hour. Pt states when his allergies are bad this happens. Pt denies hx of COPD or asthma. Pt denies any syncopal episodes. Pt is alert and oriented.

## 2017-12-27 NOTE — ED Notes (Signed)
Pt reports he is feeling better. Pt ambulating in room getting dressed. EDP notified.
